# Patient Record
Sex: Female | Born: 1945 | Race: Black or African American | Hispanic: No | Marital: Married | Smoking: Current some day smoker
Health system: Southern US, Community
[De-identification: ages and names within clinical notes are randomized; demographics above are authoritative.]

## PROBLEM LIST (undated history)

## (undated) ENCOUNTER — Emergency Department: Admission: EM | Payer: Medicare HMO

## (undated) DIAGNOSIS — I10 Essential (primary) hypertension: Secondary | ICD-10-CM

## (undated) DIAGNOSIS — N189 Chronic kidney disease, unspecified: Secondary | ICD-10-CM

## (undated) DIAGNOSIS — M810 Age-related osteoporosis without current pathological fracture: Secondary | ICD-10-CM

---

## 2001-03-13 HISTORY — PX: JOINT REPLACEMENT: SHX530

## 2007-02-26 ENCOUNTER — Ambulatory Visit: Payer: Self-pay

## 2007-03-05 ENCOUNTER — Ambulatory Visit: Payer: Self-pay

## 2008-01-22 ENCOUNTER — Ambulatory Visit: Payer: Self-pay

## 2009-01-13 ENCOUNTER — Ambulatory Visit: Payer: Self-pay

## 2010-01-19 ENCOUNTER — Ambulatory Visit: Payer: Self-pay | Admitting: Family Medicine

## 2011-01-23 ENCOUNTER — Ambulatory Visit: Payer: Self-pay | Admitting: Family Medicine

## 2011-05-17 ENCOUNTER — Ambulatory Visit: Payer: Self-pay | Admitting: Family Medicine

## 2011-06-07 DIAGNOSIS — M810 Age-related osteoporosis without current pathological fracture: Secondary | ICD-10-CM | POA: Insufficient documentation

## 2012-02-07 ENCOUNTER — Ambulatory Visit: Payer: Self-pay | Admitting: Family Medicine

## 2013-01-06 ENCOUNTER — Ambulatory Visit: Payer: Self-pay | Admitting: Family Medicine

## 2013-03-03 ENCOUNTER — Ambulatory Visit: Payer: Self-pay | Admitting: Family Medicine

## 2013-08-03 ENCOUNTER — Emergency Department: Payer: Self-pay | Admitting: Emergency Medicine

## 2013-08-11 ENCOUNTER — Emergency Department: Payer: Self-pay | Admitting: Emergency Medicine

## 2014-03-04 ENCOUNTER — Ambulatory Visit: Payer: Self-pay | Admitting: Family Medicine

## 2014-07-09 ENCOUNTER — Other Ambulatory Visit: Payer: Self-pay | Admitting: Family Medicine

## 2014-07-09 DIAGNOSIS — M810 Age-related osteoporosis without current pathological fracture: Secondary | ICD-10-CM

## 2014-07-16 ENCOUNTER — Ambulatory Visit: Payer: Self-pay

## 2014-07-27 ENCOUNTER — Ambulatory Visit
Admission: RE | Admit: 2014-07-27 | Discharge: 2014-07-27 | Disposition: A | Discharge status: Home / Self Care | Payer: Medicare HMO | Source: Ambulatory Visit | Attending: Family Medicine | Admitting: Family Medicine

## 2014-07-27 DIAGNOSIS — M8580 Other specified disorders of bone density and structure, unspecified site: Secondary | ICD-10-CM | POA: Diagnosis not present

## 2014-07-27 DIAGNOSIS — Z8739 Personal history of other diseases of the musculoskeletal system and connective tissue: Secondary | ICD-10-CM | POA: Diagnosis present

## 2014-07-27 DIAGNOSIS — M810 Age-related osteoporosis without current pathological fracture: Secondary | ICD-10-CM

## 2014-07-27 DIAGNOSIS — N189 Chronic kidney disease, unspecified: Secondary | ICD-10-CM | POA: Diagnosis present

## 2014-10-01 ENCOUNTER — Encounter: Payer: Self-pay | Admitting: *Deleted

## 2014-10-02 ENCOUNTER — Encounter: Payer: Self-pay | Admitting: Anesthesiology

## 2014-10-02 ENCOUNTER — Encounter: Payer: Self-pay | Admitting: *Deleted

## 2014-10-02 ENCOUNTER — Ambulatory Visit
Admission: RE | Admit: 2014-10-02 | Discharge: 2014-10-02 | Disposition: A | Discharge status: Home / Self Care | Payer: Medicare HMO | Source: Ambulatory Visit | Attending: Gastroenterology | Admitting: Gastroenterology

## 2014-10-02 ENCOUNTER — Encounter
Admission: RE | Disposition: A | Discharge status: Home / Self Care | Payer: Self-pay | Source: Ambulatory Visit | Attending: Gastroenterology

## 2014-10-02 DIAGNOSIS — N183 Chronic kidney disease, stage 3 (moderate): Secondary | ICD-10-CM | POA: Insufficient documentation

## 2014-10-02 DIAGNOSIS — Z1211 Encounter for screening for malignant neoplasm of colon: Secondary | ICD-10-CM | POA: Insufficient documentation

## 2014-10-02 DIAGNOSIS — D125 Benign neoplasm of sigmoid colon: Secondary | ICD-10-CM | POA: Diagnosis not present

## 2014-10-02 DIAGNOSIS — I129 Hypertensive chronic kidney disease with stage 1 through stage 4 chronic kidney disease, or unspecified chronic kidney disease: Secondary | ICD-10-CM | POA: Diagnosis not present

## 2014-10-02 HISTORY — PX: COLONOSCOPY WITH PROPOFOL: SHX5780

## 2014-10-02 HISTORY — DX: Chronic kidney disease, unspecified: N18.9

## 2014-10-02 HISTORY — DX: Age-related osteoporosis without current pathological fracture: M81.0

## 2014-10-02 HISTORY — DX: Essential (primary) hypertension: I10

## 2014-10-02 HISTORY — DX: Hypercalcemia: E83.52

## 2014-10-02 SURGERY — COLONOSCOPY WITH PROPOFOL
Anesthesia: General

## 2014-10-02 MED ORDER — LIDOCAINE HCL (PF) 1 % IJ SOLN
2.0000 mL | Freq: Once | INTRAMUSCULAR | Status: AC
  Administered 2014-10-02: 0.3 mL via INTRADERMAL

## 2014-10-02 MED ORDER — LIDOCAINE HCL (PF) 1 % IJ SOLN
INTRAMUSCULAR | Status: AC
  Administered 2014-10-02: 0.3 mL via INTRADERMAL
  Filled 2014-10-02: qty 2

## 2014-10-02 MED ORDER — SODIUM CHLORIDE 0.9 % IV SOLN
INTRAVENOUS | Status: DC
  Administered 2014-10-02: 1000 mL via INTRAVENOUS

## 2014-10-02 MED ORDER — MIDAZOLAM HCL 5 MG/5ML IJ SOLN
INTRAMUSCULAR | Status: DC | PRN
  Administered 2014-10-02: 2 mg via INTRAVENOUS
  Administered 2014-10-02: 1 mg via INTRAVENOUS
  Administered 2014-10-02: 2 mg via INTRAVENOUS

## 2014-10-02 MED ORDER — SODIUM CHLORIDE 0.9 % IV SOLN: INTRAVENOUS | Status: DC

## 2014-10-02 MED ORDER — FENTANYL CITRATE (PF) 100 MCG/2ML IJ SOLN
INTRAMUSCULAR | Status: AC
  Filled 2014-10-02: qty 4

## 2014-10-02 MED ORDER — MIDAZOLAM HCL 5 MG/5ML IJ SOLN
INTRAMUSCULAR | Status: AC
  Filled 2014-10-02: qty 10

## 2014-10-02 MED ORDER — FENTANYL CITRATE (PF) 100 MCG/2ML IJ SOLN
INTRAMUSCULAR | Status: DC | PRN
  Administered 2014-10-02: 50 ug via INTRAVENOUS
  Administered 2014-10-02: 25 ug via INTRAVENOUS
  Administered 2014-10-02: 50 ug via INTRAVENOUS

## 2014-10-02 NOTE — Discharge Instructions (Signed)

## 2014-10-02 NOTE — Op Note (Signed)
[facility] Gastroenterology Patient Name: Michelle Kaufman Procedure Date: 10/02/2014 10:19 AM MRN: [id number] Account #: [id number] Date of Birth: 1945-07-14 Admit Type: Outpatient Age: 69 Room: [facility] ENDO ROOM 3 Gender: Female Note Status: Finalized Procedure:         Colonoscopy Indications:       Screening for colorectal malignant neoplasm, This is the                     patient's first colonoscopy Patient Profile:   This is a 69 year old female. Providers:         Gerrit Heck. Rayann Heman, MD Referring MD:      Marguerita Merles, MD (Referring MD) Medicines:         Fentanyl 125 micrograms IV, Midazolam 5 mg IV Complications:     No immediate complications. Procedure:         Pre-Anesthesia Assessment:                    - Prior to the procedure, a History and Physical was                     performed, and patient medications, allergies and                     sensitivities were reviewed. The patient's tolerance of                     previous anesthesia was reviewed.                    After obtaining informed consent, the colonoscope was                     passed under direct vision. Throughout the procedure, the                     patient's blood pressure, pulse, and oxygen saturations                     were monitored continuously. The Colonoscope was                     introduced through the anus and advanced to the the                     terminal ileum. The colonoscopy was somewhat difficult due                     to a tortuous colon. Successful completion of the                     procedure was aided by applying abdominal pressure. The                     patient tolerated the procedure well. The quality of the                     bowel preparation was good. Findings:      The perianal and digital rectal examinations were normal.      A 3 mm polyp was found in the sigmoid colon. The polyp was sessile. The       polyp was removed with a jumbo cold forceps.  Resection and retrieval       were complete.  The exam was otherwise without abnormality on direct and retroflexion       views. Impression:        - One 3 mm polyp in the sigmoid colon. Resected and                     retrieved.                    - The examination was otherwise normal on direct and                     retroflexion views. Recommendation:    - Observe patient in GI recovery unit.                    - High fiber diet.                    - Continue present medications.                    - Await pathology results.                    - Repeat colonoscopy for surveillance based on pathology                     results.                    - The findings and recommendations were discussed with the                     patient.                    - The findings and recommendations were discussed with the                     patient's family. Procedure Code(s): --- Professional ---                    [phone], Colonoscopy, flexible; with biopsy, single or                     multiple CPT copyright 2014 American Medical Association. All rights reserved. The codes documented in this report are preliminary and upon coder review may  be revised to meet current compliance requirements. Mellody Life, MD 10/02/2014 11:02:22 AM This report has been signed electronically. Number of Addenda: 0 Note Initiated On: 10/02/2014 10:19 AM Scope Withdrawal Time: 0 hours 15 minutes 17 seconds  Total Procedure Duration: 0 hours 28 minutes 36 seconds       [facility]

## 2014-10-02 NOTE — H&P (Signed)
  Primary Care Physician:  Pcp Not In System  Pre-Procedure History & Physical: HPI:  Michelle Kaufman is a 69 y.o. female is here for an colonoscopy.   Past Medical History  Diagnosis Date  . Chronic kidney disease     CKD, Stage 3  . Hypertension   . Osteoporosis   . Hypercalcemia     Past Surgical History  Procedure Laterality Date  . Joint replacement Right 2003    Prior to Admission medications   Medication Sig Start Date End Date Taking? Authorizing Provider  aspirin EC 81 MG tablet Take 81 mg by mouth daily.   Yes Historical Provider, MD  Cholecalciferol 1000 UNITS TBDP Take 1,000 Units by mouth daily.   Yes Historical Provider, MD  lisinopril (PRINIVIL,ZESTRIL) 10 MG tablet Take 10 mg by mouth daily.   Yes Historical Provider, MD    Allergies as of 09/11/2014  . (Not on File)    History reviewed. No pertinent family history.  History   Social History  . Marital Status: Married    Spouse Name: N/A  . Number of Children: N/A  . Years of Education: N/A   Occupational History  . Not on file.   Social History Main Topics  . Smoking status: Not on file  . Smokeless tobacco: Not on file  . Alcohol Use: Not on file  . Drug Use: Not on file  . Sexual Activity: Not on file   Other Topics Concern  . Not on file   Social History Narrative     Physical Exam: BP 117/69 mmHg  Pulse 65  Temp(Src) 97.3 F (36.3 C) (Tympanic)  Resp 17  Ht 5' (1.524 m)  Wt 56.7 kg (125 lb)  BMI 24.41 kg/m2  SpO2 96% General:   Alert,  pleasant and cooperative in NAD Head:  Normocephalic and atraumatic. Neck:  Supple; no masses or thyromegaly. Lungs:  Clear throughout to auscultation.    Heart:  Regular rate and rhythm. Abdomen:  Soft, nontender and nondistended. Normal bowel sounds, without guarding, and without rebound.   Neurologic:  Alert and  oriented x4;  grossly normal neurologically.  Impression/Plan: Michelle Kaufman is here for an colonoscopy to be performed for  screening  Risks, benefits, limitations, and alternatives regarding  colonoscopy have been reviewed with the patient.  Questions have been answered.  All parties agreeable.   Josefine Class, MD  10/02/2014, 10:09 AM

## 2014-10-05 ENCOUNTER — Encounter: Payer: Self-pay | Admitting: Gastroenterology

## 2014-10-06 LAB — SURGICAL PATHOLOGY

## 2014-11-26 DIAGNOSIS — M069 Rheumatoid arthritis, unspecified: Secondary | ICD-10-CM | POA: Insufficient documentation

## 2014-12-30 ENCOUNTER — Other Ambulatory Visit: Payer: Self-pay | Admitting: Preventative Medicine

## 2014-12-30 DIAGNOSIS — Z1239 Encounter for other screening for malignant neoplasm of breast: Secondary | ICD-10-CM

## 2015-02-15 DIAGNOSIS — J449 Chronic obstructive pulmonary disease, unspecified: Secondary | ICD-10-CM | POA: Insufficient documentation

## 2015-03-09 ENCOUNTER — Ambulatory Visit
Admission: RE | Admit: 2015-03-09 | Discharge: 2015-03-09 | Disposition: A | Discharge status: Home / Self Care | Payer: Medicare HMO | Source: Ambulatory Visit | Attending: Preventative Medicine | Admitting: Preventative Medicine

## 2015-03-09 ENCOUNTER — Other Ambulatory Visit: Payer: Self-pay | Admitting: Preventative Medicine

## 2015-03-09 DIAGNOSIS — Z1231 Encounter for screening mammogram for malignant neoplasm of breast: Secondary | ICD-10-CM | POA: Insufficient documentation

## 2015-03-09 DIAGNOSIS — Z1239 Encounter for other screening for malignant neoplasm of breast: Secondary | ICD-10-CM

## 2016-01-26 ENCOUNTER — Other Ambulatory Visit: Payer: Self-pay | Admitting: Family Medicine

## 2016-01-26 DIAGNOSIS — Z1231 Encounter for screening mammogram for malignant neoplasm of breast: Secondary | ICD-10-CM

## 2016-03-09 ENCOUNTER — Other Ambulatory Visit: Payer: Self-pay | Admitting: Family Medicine

## 2016-03-09 ENCOUNTER — Ambulatory Visit
Admission: RE | Admit: 2016-03-09 | Discharge: 2016-03-09 | Disposition: A | Discharge status: Home / Self Care | Payer: Medicare HMO | Source: Ambulatory Visit | Attending: Family Medicine | Admitting: Family Medicine

## 2016-03-09 DIAGNOSIS — Z1231 Encounter for screening mammogram for malignant neoplasm of breast: Secondary | ICD-10-CM | POA: Diagnosis not present

## 2016-11-08 ENCOUNTER — Other Ambulatory Visit: Payer: Self-pay | Admitting: Family Medicine

## 2016-11-08 DIAGNOSIS — Z1231 Encounter for screening mammogram for malignant neoplasm of breast: Secondary | ICD-10-CM

## 2016-11-08 DIAGNOSIS — Z13820 Encounter for screening for osteoporosis: Secondary | ICD-10-CM

## 2017-03-12 ENCOUNTER — Ambulatory Visit
Admission: RE | Admit: 2017-03-12 | Discharge: 2017-03-12 | Disposition: A | Discharge status: Home / Self Care | Payer: Medicare HMO | Source: Ambulatory Visit | Attending: Family Medicine | Admitting: Family Medicine

## 2017-03-12 DIAGNOSIS — Z1231 Encounter for screening mammogram for malignant neoplasm of breast: Secondary | ICD-10-CM | POA: Diagnosis not present

## 2017-03-12 DIAGNOSIS — Z13820 Encounter for screening for osteoporosis: Secondary | ICD-10-CM | POA: Diagnosis present

## 2017-03-12 DIAGNOSIS — M810 Age-related osteoporosis without current pathological fracture: Secondary | ICD-10-CM | POA: Diagnosis not present

## 2017-07-15 ENCOUNTER — Other Ambulatory Visit: Payer: Self-pay

## 2017-07-15 ENCOUNTER — Encounter: Payer: Self-pay | Admitting: Emergency Medicine

## 2017-07-15 ENCOUNTER — Emergency Department
Admission: EM | Admit: 2017-07-15 | Discharge: 2017-07-15 | Disposition: A | Discharge status: Home / Self Care | Payer: Medicare HMO | Attending: Emergency Medicine | Admitting: Emergency Medicine

## 2017-07-15 DIAGNOSIS — Z7982 Long term (current) use of aspirin: Secondary | ICD-10-CM | POA: Insufficient documentation

## 2017-07-15 DIAGNOSIS — F17210 Nicotine dependence, cigarettes, uncomplicated: Secondary | ICD-10-CM | POA: Diagnosis not present

## 2017-07-15 DIAGNOSIS — R42 Dizziness and giddiness: Secondary | ICD-10-CM | POA: Insufficient documentation

## 2017-07-15 DIAGNOSIS — R55 Syncope and collapse: Secondary | ICD-10-CM

## 2017-07-15 DIAGNOSIS — Z79899 Other long term (current) drug therapy: Secondary | ICD-10-CM | POA: Diagnosis not present

## 2017-07-15 DIAGNOSIS — N183 Chronic kidney disease, stage 3 (moderate): Secondary | ICD-10-CM | POA: Diagnosis not present

## 2017-07-15 DIAGNOSIS — R1110 Vomiting, unspecified: Secondary | ICD-10-CM | POA: Diagnosis not present

## 2017-07-15 DIAGNOSIS — I129 Hypertensive chronic kidney disease with stage 1 through stage 4 chronic kidney disease, or unspecified chronic kidney disease: Secondary | ICD-10-CM | POA: Insufficient documentation

## 2017-07-15 DIAGNOSIS — N390 Urinary tract infection, site not specified: Secondary | ICD-10-CM

## 2017-07-15 LAB — URINALYSIS, COMPLETE (UACMP) WITH MICROSCOPIC
Bilirubin Urine: NEGATIVE
Glucose, UA: NEGATIVE mg/dL
Ketones, ur: NEGATIVE mg/dL
LEUKOCYTES UA: NEGATIVE
Nitrite: POSITIVE — AB
PH: 5 (ref 5.0–8.0)
Protein, ur: NEGATIVE mg/dL
SPECIFIC GRAVITY, URINE: 1.01 (ref 1.005–1.030)

## 2017-07-15 LAB — CBC
HEMATOCRIT: 45.2 % (ref 35.0–47.0)
HEMOGLOBIN: 15 g/dL (ref 12.0–16.0)
MCH: 28.6 pg (ref 26.0–34.0)
MCHC: 33.1 g/dL (ref 32.0–36.0)
MCV: 86.4 fL (ref 80.0–100.0)
PLATELETS: 243 10*3/uL (ref 150–440)
RBC: 5.23 MIL/uL — AB (ref 3.80–5.20)
RDW: 14.9 % — ABNORMAL HIGH (ref 11.5–14.5)
WBC: 5.2 10*3/uL (ref 3.6–11.0)

## 2017-07-15 LAB — BASIC METABOLIC PANEL
ANION GAP: 8 (ref 5–15)
BUN: 16 mg/dL (ref 6–20)
CO2: 27 mmol/L (ref 22–32)
Calcium: 9.4 mg/dL (ref 8.9–10.3)
Chloride: 101 mmol/L (ref 101–111)
Creatinine, Ser: 0.91 mg/dL (ref 0.44–1.00)
GFR calc Af Amer: >60 mL/min (ref >60)
GLUCOSE: 101 mg/dL — AB (ref 65–99)
POTASSIUM: 3.7 mmol/L (ref 3.5–5.1)
Sodium: 136 mmol/L (ref 135–145)

## 2017-07-15 LAB — GLUCOSE, CAPILLARY: GLUCOSE-CAPILLARY: 87 mg/dL (ref 65–99)

## 2017-07-15 LAB — TROPONIN I

## 2017-07-15 MED ORDER — CEPHALEXIN 500 MG PO CAPS: 500.0000 mg | ORAL_CAPSULE | Freq: Three times a day (TID) | ORAL | 0 refills | Status: AC

## 2017-07-15 MED ORDER — CEPHALEXIN 500 MG PO CAPS
500.0000 mg | ORAL_CAPSULE | Freq: Once | ORAL | Status: AC
  Administered 2017-07-15: 500 mg via ORAL
  Filled 2017-07-15: qty 1

## 2017-07-15 MED ORDER — SODIUM CHLORIDE 0.9 % IV BOLUS
1000.0000 mL | Freq: Once | INTRAVENOUS | Status: AC
Start: 2017-07-15 — End: 2017-07-15
  Administered 2017-07-15: 1000 mL via INTRAVENOUS

## 2017-07-15 NOTE — ED Provider Notes (Signed)
[facility] Emergency Department Provider Note  ____________________________________________   First MD Initiated Contact with Patient 07/15/17 1406     (approximate)  I have reviewed the triage vital signs and the nursing notes.   HISTORY  Chief Complaint Dizziness   HPI Michelle Kaufman is a 72 y.o. female with a history of CKD who is presenting to the emergency department after near syncopal episode.  The patient says that she was sitting in church for 20 or 30 minutes when she stood up quickly and began to feel lightheaded.  She then vomited.  Did not pass out.  Said that the entire episode lasted several minutes and that she now still back to baseline.  Did not feel her heart racing.  No chest pain or shortness of breath.  Denies any burning with urination.   Past Medical History:  Diagnosis Date  . Chronic kidney disease    CKD, Stage 3  . Hypercalcemia   . Hypertension   . Osteoporosis     There are no active problems to display for this patient.   Past Surgical History:  Procedure Laterality Date  . COLONOSCOPY WITH PROPOFOL N/A 10/02/2014   Procedure: COLONOSCOPY WITH PROPOFOL;  Surgeon: Josefine Class, MD;  Location: [facility] ENDOSCOPY;  Service: Endoscopy;  Laterality: N/A;  . JOINT REPLACEMENT Right 2003    Prior to Admission medications   Medication Sig Start Date End Date Taking? Authorizing Provider  aspirin EC 81 MG tablet Take 81 mg by mouth daily.    [provider]  Cholecalciferol 1000 UNITS TBDP Take 1,000 Units by mouth daily.    [provider]  lisinopril (PRINIVIL,ZESTRIL) 10 MG tablet Take 10 mg by mouth daily.    [provider]    Allergies Patient has no known allergies.  Family History  Problem Relation Age of Onset  . Breast cancer Neg Hx     Social History Social History   Tobacco Use  . Smoking status: Current Every Day Smoker    Packs/day: 0.50    Types: Cigarettes  .  Smokeless tobacco: Never Used  Substance Use Topics  . Alcohol use: Not on file  . Drug use: Not on file    Review of Systems  Constitutional: No fever/chills Eyes: No visual changes. ENT: No sore throat. Cardiovascular: Denies chest pain. Respiratory: Denies shortness of breath. Gastrointestinal: No abdominal pain.  No nausea, no vomiting.  No diarrhea.  No constipation. Genitourinary: Negative for dysuria. Musculoskeletal: Negative for back pain. Skin: Negative for rash. Neurological: Negative for headaches, focal weakness or numbness.   ____________________________________________   PHYSICAL EXAM:  VITAL SIGNS: ED Triage Vitals  Enc Vitals Group     BP 07/15/17 1312 (!) 145/97     Pulse Rate 07/15/17 1312 68     Resp 07/15/17 1312 18     Temp 07/15/17 1312 98.3 F (36.8 C)     Temp Source 07/15/17 1312 Oral     SpO2 07/15/17 1312 97 %     Weight 07/15/17 1313 130 lb (59 kg)     Height 07/15/17 1313 5' (1.524 m)     Head Circumference --      Peak Flow --      Pain Score 07/15/17 1313 0     Pain Loc --      Pain Edu? --      Excl. in [place]? --     Constitutional: Alert and oriented. Well appearing and in no  acute distress. Eyes: Conjunctivae are normal.  Head: Atraumatic. Nose: No congestion/rhinnorhea. Mouth/Throat: Mucous membranes are moist.  Neck: No stridor.   Cardiovascular: Normal rate, regular rhythm. Grossly normal heart sounds.   Respiratory: Normal respiratory effort.  No retractions. Lungs CTAB. Gastrointestinal: Soft and nontender. No distention.  Musculoskeletal: No lower extremity tenderness nor edema.  No joint effusions. Neurologic:  Normal speech and language. No gross focal neurologic deficits are appreciated. Skin:  Skin is warm, dry and intact. No rash noted. Psychiatric: Mood and affect are normal. Speech and behavior are normal.  ____________________________________________   LABS (all labs ordered are listed, but only abnormal  results are displayed)  Labs Reviewed  BASIC METABOLIC PANEL - Abnormal; Notable for the following components:      Result Value   Glucose, Bld 101 (*)    All other components within normal limits  CBC - Abnormal; Notable for the following components:   RBC 5.23 (*)    RDW 14.9 (*)    All other components within normal limits  URINALYSIS, COMPLETE (UACMP) WITH MICROSCOPIC - Abnormal; Notable for the following components:   Color, Urine YELLOW (*)    APPearance CLEAR (*)    Hgb urine dipstick SMALL (*)    Nitrite POSITIVE (*)    Bacteria, UA MANY (*)    All other components within normal limits  URINE CULTURE  TROPONIN I  GLUCOSE, CAPILLARY  CBG MONITORING, ED   ____________________________________________  EKG  ED ECG REPORT I, Doran Stabler, the attending physician, personally viewed and interpreted this ECG.   Date: 07/15/2017  EKG Time: 1323  Rate: 66  Rhythm: normal sinus rhythm  Axis: Normal  Intervals:none  ST&T Change: No ST segment elevation or depression.  T wave inversions in V3 through V5.  No previous for comparison.  ____________________________________________  RADIOLOGY   ____________________________________________   PROCEDURES  Procedure(s) performed:   Procedures  Critical Care performed:   ____________________________________________   INITIAL IMPRESSION / ASSESSMENT AND PLAN / ED COURSE  Pertinent labs & imaging results that were available during my care of the patient were reviewed by me and considered in my medical decision making (see chart for details).  DDX: Vertigo, near-syncope, UTI, anemia, electrolyte abnormality, arrhythmia As part of my medical decision making, I reviewed the following data within the [place] Notes from prior outpatient records.  Patient likely with near syncope from UTI.  Patient asymptomatic at this time.  EKG changes without past EKG for comparison.  We will recheck her  troponin.  ----------------------------------------- 6:31 PM on 07/15/2017 -----------------------------------------  Patient at this time without complaint.  Troponin hours after the event is negative.  Unclear significance of T wave inversions.  Also unclear chronicity.  No previous EKG to compare with.  Patient will be discharged home with treatment for UTI.  Likely syncopal episode secondary to standing up quickly in combination with UTI.  Patient is understanding of the diagnosis as well as treatment plan willing to comply.  Patient will follow-up with cardiology. ____________________________________________   FINAL CLINICAL IMPRESSION(S) / ED DIAGNOSES  UTI.  Near syncope.    NEW MEDICATIONS STARTED DURING THIS VISIT:  New Prescriptions   No medications on file     Note:  This document was prepared using Dragon voice recognition software and may include unintentional dictation errors.     Orbie Pyo, MD 07/15/17 [phone]

## 2017-07-15 NOTE — ED Triage Notes (Signed)
Pt reports dizziness upon standing during church.  Pt states she has not been on medication for BP in 2 years.  Pt states she may have stood up too fast during church. Pt denies any other pain.  Pt also reports itching to the middle of her chest as well.

## 2017-07-17 LAB — URINE CULTURE: Culture: >=100000 — AB

## 2018-02-04 ENCOUNTER — Other Ambulatory Visit: Payer: Self-pay | Admitting: Family Medicine

## 2018-02-04 DIAGNOSIS — Z1231 Encounter for screening mammogram for malignant neoplasm of breast: Secondary | ICD-10-CM

## 2018-03-01 ENCOUNTER — Encounter: Payer: Self-pay | Admitting: Emergency Medicine

## 2018-03-01 ENCOUNTER — Other Ambulatory Visit: Payer: Self-pay

## 2018-03-01 ENCOUNTER — Emergency Department
Admission: EM | Admit: 2018-03-01 | Discharge: 2018-03-01 | Disposition: A | Discharge status: Home / Self Care | Payer: Medicare HMO | Attending: Emergency Medicine | Admitting: Emergency Medicine

## 2018-03-01 DIAGNOSIS — Z7982 Long term (current) use of aspirin: Secondary | ICD-10-CM | POA: Diagnosis not present

## 2018-03-01 DIAGNOSIS — M7022 Olecranon bursitis, left elbow: Secondary | ICD-10-CM | POA: Insufficient documentation

## 2018-03-01 DIAGNOSIS — N183 Chronic kidney disease, stage 3 (moderate): Secondary | ICD-10-CM | POA: Diagnosis not present

## 2018-03-01 DIAGNOSIS — Z79899 Other long term (current) drug therapy: Secondary | ICD-10-CM | POA: Diagnosis not present

## 2018-03-01 DIAGNOSIS — Y939 Activity, unspecified: Secondary | ICD-10-CM | POA: Diagnosis not present

## 2018-03-01 DIAGNOSIS — R2232 Localized swelling, mass and lump, left upper limb: Secondary | ICD-10-CM | POA: Diagnosis not present

## 2018-03-01 DIAGNOSIS — F17210 Nicotine dependence, cigarettes, uncomplicated: Secondary | ICD-10-CM | POA: Insufficient documentation

## 2018-03-01 DIAGNOSIS — I129 Hypertensive chronic kidney disease with stage 1 through stage 4 chronic kidney disease, or unspecified chronic kidney disease: Secondary | ICD-10-CM | POA: Diagnosis not present

## 2018-03-01 DIAGNOSIS — M25522 Pain in left elbow: Secondary | ICD-10-CM | POA: Diagnosis present

## 2018-03-01 MED ORDER — KETOROLAC TROMETHAMINE 30 MG/ML IJ SOLN
15.0000 mg | Freq: Once | INTRAMUSCULAR | Status: AC
  Administered 2018-03-01: 15 mg via INTRAMUSCULAR
  Filled 2018-03-01: qty 1

## 2018-03-01 NOTE — ED Notes (Signed)
PT OBSERVED WITH SWELLING TO LEFT ELBOW , WARM TO TOUCH , DENIES ANY INJURY , DOES REPORT THAT SHE SLEPT ON IT JUST PRIOR TO ONSET OF PAIN .

## 2018-03-01 NOTE — Discharge Instructions (Addendum)
As we discussed, we believe the pain and swelling in your elbow is due to a condition called bursitis.  Please use the wrap provided in the sling as needed for comfort.  You were given a medication called Toradol 15 mg intramuscular and starting in the morning we recommend that you take 400 mg of ibuprofen 3 times a day with meals.  However we encourage you to go to the [facility] walk-in clinic this morning for reassessment and further evaluation.    Return to the emergency department if you develop new or worsening symptoms that concern you.

## 2018-03-01 NOTE — ED Triage Notes (Addendum)
Patient ambulatory to triage with steady gait, without difficulty or distress noted; pt reports awoke with left elbow pain; denies any other accomp symptoms; denies any injury; st pain with movement

## 2018-03-01 NOTE — ED Provider Notes (Signed)
[facility] Emergency Department Provider Note  ____________________________________________   First MD Initiated Contact with Patient 03/01/18 [phone]     (approximate)  I have reviewed the triage vital signs and the nursing notes.   HISTORY  Chief Complaint Elbow Pain    HPI Michelle Kaufman is a 72 y.o. female with medical history as listed below which notably includes rheumatoid arthritis.  She presents for evaluation of acute onset pain and swelling in her left elbow.  She reports that her elbow was hurting a little bit before she went to bed but when she woke up she was lying on the elbow and the pain was severe.  It is worse with flexion and extension of the elbow and with touching the outside of the elbow.  Nothing in particular makes it better.  It seems little bit swollen but it is not red.  She has not had an infection of the arm of the skin and has had no recent injuries including no lacerations or injections.  She has not had this problem in the past.  She has no history of gout.  She does not take blood thinners.  She denies fever/chills, chest pain, shortness of breath, nausea, vomiting, and abdominal pain.  Past Medical History:  Diagnosis Date  . Chronic kidney disease    CKD, Stage 3  . Hypercalcemia   . Hypertension   . Osteoporosis     There are no active problems to display for this patient.   Past Surgical History:  Procedure Laterality Date  . COLONOSCOPY WITH PROPOFOL N/A 10/02/2014   Procedure: COLONOSCOPY WITH PROPOFOL;  Surgeon: Josefine Class, MD;  Location: [facility] ENDOSCOPY;  Service: Endoscopy;  Laterality: N/A;  . JOINT REPLACEMENT Right 2003    Prior to Admission medications   Medication Sig Start Date End Date Taking? Authorizing Provider  aspirin EC 81 MG tablet Take 81 mg by mouth daily.    [provider]  Cholecalciferol 1000 UNITS TBDP Take 1,000 Units by mouth daily.    [provider]    lisinopril (PRINIVIL,ZESTRIL) 10 MG tablet Take 10 mg by mouth daily.    [provider]    Allergies Patient has no known allergies.  Family History  Problem Relation Age of Onset  . Breast cancer Neg Hx     Social History Social History   Tobacco Use  . Smoking status: Current Every Day Smoker    Packs/day: 0.50    Types: Cigarettes  . Smokeless tobacco: Never Used  Substance Use Topics  . Alcohol use: Not on file  . Drug use: Not on file    Review of Systems Constitutional: No fever/chills Eyes: No visual changes. ENT: No sore throat. Cardiovascular: Denies chest pain. Respiratory: Denies shortness of breath. Gastrointestinal: No abdominal pain.  No nausea, no vomiting.  No diarrhea.  No constipation. Genitourinary: Negative for dysuria. Musculoskeletal: Pain and swelling in left elbow as described above.  Negative for neck pain.  Negative for back pain. Integumentary: Negative for rash, no redness or skin injury to the left elbow. Neurological: Negative for headaches, focal weakness or numbness.   ____________________________________________   PHYSICAL EXAM:  VITAL SIGNS: ED Triage Vitals  Enc Vitals Group     BP 03/01/18 0311 (!) 116/91     Pulse Rate 03/01/18 0311 70     Resp 03/01/18 0311 16     Temp 03/01/18 0311 97.9 F (36.6 C)     Temp Source 03/01/18  6578 Oral     SpO2 03/01/18 0311 98 %     Weight 03/01/18 0305 64 kg (141 lb)     Height 03/01/18 0305 1.575 m (5\' 2" )     Head Circumference --      Peak Flow --      Pain Score --      Pain Loc --      Pain Edu? --      Excl. in [place]? --     Constitutional: Alert and oriented. Well appearing and in no acute distress.  Eyes: Conjunctivae are normal.  Head: Atraumatic. Nose: No congestion/rhinnorhea. Mouth/Throat: Mucous membranes are moist. Neck: No stridor.  No meningeal signs.   Cardiovascular: Normal rate, regular rhythm. Good peripheral circulation. Respiratory: Normal  respiratory effort.  No retractions.  Musculoskeletal: The patient has some mild swelling around the olecranon process of the left elbow with no erythema and no abnormal warmth.  Is tender to palpation and fluctuant, consistent with inflammatory bursitis.  She resists flexion and extension of the elbow.  There is no acute skin disruption or indication that she has a superficial infection including cellulitis.  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 Neurologic:  Normal speech and language. No gross focal neurologic deficits are appreciated.  Skin:  Skin is warm, dry and intact. No rash noted.   ____________________________________________   LABS (all labs ordered are listed, but only abnormal results are displayed)  Labs Reviewed - No data to display ____________________________________________  EKG  ED ECG REPORT I, Hinda Kehr, the attending physician, personally viewed and interpreted this ECG.  Date: 03/01/2018 EKG Time: 3:15 AM Rate: 64 Rhythm: normal sinus rhythm QRS Axis: normal Intervals: normal ST/T Wave abnormalities: Inverted T waves in leads II, III, aVF, V3, V4, V5, and V6.  These are similar to inversions in prior EKGs.   Narrative Interpretation: no evidence of acute ischemia   ____________________________________________  RADIOLOGY   ED MD interpretation: No indication for imaging  Official radiology report(s): No results found.  ____________________________________________   PROCEDURES  Critical Care performed: No   Procedure(s) performed:   Procedures   ____________________________________________   INITIAL IMPRESSION / ASSESSMENT AND PLAN / ED COURSE  As part of my medical decision making, I reviewed the following data within the [place] notes reviewed and incorporated, EKG interpreted , Old EKG reviewed and Notes from  prior ED visits    Differential diagnosis includes, but is not limited to, inflammatory bursitis, septic bursitis, septic arthritis, cellulitis, gout, less likely DVT or acute bony injury including fracture or dislocation.  The physical exam is consistent with an olecranon bursitis that is likely inflammatory in nature and could have been worsened by her sleeping position because she does state that she was sleeping on the elbow.  I offered to aspirate the bursa to look for signs of infection but after having my usual and customary risks and benefits discussion, the patient declined.  She prefers conservative treatment including Toradol 50 mg intramuscular, Ace wrap, sling, and close follow-up with orthopedics clinic later today.  I think that is appropriate.  My suspicion for septic arthritis is extremely low and even for septic bursitis.  There is no evidence of any overlying skin infection and the process does appear inflammatory.  I gave strict return precautions if her symptoms worsen and she plans to go  to the orthopedic walk-in clinic later today.  There is no indication for imaging as plain radiographs will not be helpful and an MRI is unnecessary in the emergent setting.     ____________________________________________  FINAL CLINICAL IMPRESSION(S) / ED DIAGNOSES  Final diagnoses:  Olecranon bursitis of left elbow     MEDICATIONS GIVEN DURING THIS VISIT:  Medications  ketorolac (TORADOL) 30 MG/ML injection 15 mg (has no administration in time range)     ED Discharge Orders    None       Note:  This document was prepared using Dragon voice recognition software and may include unintentional dictation errors.    Hinda Kehr, MD 03/01/18 [phone]

## 2018-03-15 ENCOUNTER — Ambulatory Visit
Admission: RE | Admit: 2018-03-15 | Discharge: 2018-03-15 | Disposition: A | Discharge status: Home / Self Care | Payer: Medicare HMO | Source: Ambulatory Visit | Attending: Family Medicine | Admitting: Family Medicine

## 2018-03-15 DIAGNOSIS — Z1231 Encounter for screening mammogram for malignant neoplasm of breast: Secondary | ICD-10-CM | POA: Insufficient documentation

## 2018-05-17 DIAGNOSIS — E559 Vitamin D deficiency, unspecified: Secondary | ICD-10-CM | POA: Insufficient documentation

## 2018-12-13 DIAGNOSIS — K219 Gastro-esophageal reflux disease without esophagitis: Secondary | ICD-10-CM | POA: Insufficient documentation

## 2018-12-18 ENCOUNTER — Other Ambulatory Visit: Payer: Self-pay | Admitting: Family Medicine

## 2018-12-18 DIAGNOSIS — Z13820 Encounter for screening for osteoporosis: Secondary | ICD-10-CM

## 2018-12-24 ENCOUNTER — Other Ambulatory Visit: Payer: Self-pay | Admitting: Family Medicine

## 2018-12-24 DIAGNOSIS — Z1231 Encounter for screening mammogram for malignant neoplasm of breast: Secondary | ICD-10-CM

## 2019-03-17 ENCOUNTER — Ambulatory Visit
Admission: RE | Admit: 2019-03-17 | Discharge: 2019-03-17 | Disposition: A | Discharge status: Home / Self Care | Payer: Medicare HMO | Source: Ambulatory Visit | Attending: Family Medicine | Admitting: Family Medicine

## 2019-03-17 DIAGNOSIS — Z780 Asymptomatic menopausal state: Secondary | ICD-10-CM | POA: Insufficient documentation

## 2019-03-17 DIAGNOSIS — Z1231 Encounter for screening mammogram for malignant neoplasm of breast: Secondary | ICD-10-CM | POA: Diagnosis present

## 2019-03-17 DIAGNOSIS — Z13820 Encounter for screening for osteoporosis: Secondary | ICD-10-CM | POA: Diagnosis present

## 2019-09-17 ENCOUNTER — Encounter: Payer: Self-pay | Admitting: Emergency Medicine

## 2019-09-17 DIAGNOSIS — F17210 Nicotine dependence, cigarettes, uncomplicated: Secondary | ICD-10-CM | POA: Insufficient documentation

## 2019-09-17 DIAGNOSIS — I129 Hypertensive chronic kidney disease with stage 1 through stage 4 chronic kidney disease, or unspecified chronic kidney disease: Secondary | ICD-10-CM | POA: Diagnosis not present

## 2019-09-17 DIAGNOSIS — R030 Elevated blood-pressure reading, without diagnosis of hypertension: Secondary | ICD-10-CM | POA: Diagnosis not present

## 2019-09-17 DIAGNOSIS — Z7982 Long term (current) use of aspirin: Secondary | ICD-10-CM | POA: Diagnosis not present

## 2019-09-17 DIAGNOSIS — N183 Chronic kidney disease, stage 3 unspecified: Secondary | ICD-10-CM | POA: Insufficient documentation

## 2019-09-17 LAB — CBC
HCT: 42.3 % (ref 36.0–46.0)
Hemoglobin: 13.7 g/dL (ref 12.0–15.0)
MCH: 28 pg (ref 26.0–34.0)
MCHC: 32.4 g/dL (ref 30.0–36.0)
MCV: 86.5 fL (ref 80.0–100.0)
Platelets: 243 10*3/uL (ref 150–400)
RBC: 4.89 MIL/uL (ref 3.87–5.11)
RDW: 13.8 % (ref 11.5–15.5)
WBC: 6.7 10*3/uL (ref 4.0–10.5)
nRBC: 0 % (ref 0.0–0.2)

## 2019-09-17 LAB — BASIC METABOLIC PANEL
Anion gap: 9 (ref 5–15)
BUN: 17 mg/dL (ref 8–23)
CO2: 26 mmol/L (ref 22–32)
Calcium: 9.2 mg/dL (ref 8.9–10.3)
Chloride: 104 mmol/L (ref 98–111)
Creatinine, Ser: 1.08 mg/dL — ABNORMAL HIGH (ref 0.44–1.00)
GFR calc Af Amer: 59 mL/min — ABNORMAL LOW (ref >60)
GFR calc non Af Amer: 51 mL/min — ABNORMAL LOW (ref >60)
Glucose, Bld: 97 mg/dL (ref 70–99)
Potassium: 3.9 mmol/L (ref 3.5–5.1)
Sodium: 139 mmol/L (ref 135–145)

## 2019-09-17 LAB — TROPONIN I (HIGH SENSITIVITY): Troponin I (High Sensitivity): 6 ng/L (ref <18)

## 2019-09-17 NOTE — ED Triage Notes (Signed)
Pt reports she had red meat for dinner and afterwards has experienced HA and high BP readings at home. Pt denies hypertension.

## 2019-09-18 ENCOUNTER — Emergency Department
Admission: EM | Admit: 2019-09-18 | Discharge: 2019-09-18 | Disposition: A | Discharge status: Home / Self Care | Payer: Medicare HMO | Attending: Emergency Medicine | Admitting: Emergency Medicine

## 2019-09-18 DIAGNOSIS — R030 Elevated blood-pressure reading, without diagnosis of hypertension: Secondary | ICD-10-CM

## 2019-09-18 NOTE — ED Notes (Signed)
Computer not available for signature- printed and had pt sign hard copy for discharge

## 2019-09-18 NOTE — ED Notes (Signed)
Pt reassessed in the [place], VS rechecked.

## 2019-09-18 NOTE — ED Provider Notes (Signed)
[facility] Emergency Department Provider Note   ____________________________________________   First MD Initiated Contact with Patient 09/18/19 [phone]     (approximate)  I have reviewed the triage vital signs and the nursing notes.   HISTORY  Chief Complaint Hypertension    HPI Michelle Kaufman is a 74 y.o. female with past medical history of hypertension and CKD who presents to the ED complaining of elevated blood pressure.  Patient reports that she was checking her blood pressure at home last night when it seemed to be running high.  This was associated with a mild diffuse headache, she denies any chest pain, shortness of breath, fevers, cough, nausea, dysuria, or hematuria.  She states she previously took medication for her blood pressure but her PCP took her off these medications a couple of years ago and her blood pressure has been normal since.  She had follow-up with her PCP a couple of weeks ago, when her blood pressure was normal at the office.  She denies any vision changes, speech changes, numbness, or weakness.        Past Medical History:  Diagnosis Date  . Chronic kidney disease    CKD, Stage 3  . Hypercalcemia   . Hypertension   . Osteoporosis     There are no problems to display for this patient.   Past Surgical History:  Procedure Laterality Date  . COLONOSCOPY WITH PROPOFOL N/A 10/02/2014   Procedure: COLONOSCOPY WITH PROPOFOL;  Surgeon: Josefine Class, MD;  Location: [facility] ENDOSCOPY;  Service: Endoscopy;  Laterality: N/A;  . JOINT REPLACEMENT Right 2003    Prior to Admission medications   Medication Sig Start Date End Date Taking? Authorizing Provider  aspirin EC 81 MG tablet Take 81 mg by mouth daily.    [provider]  Cholecalciferol 1000 UNITS TBDP Take 1,000 Units by mouth daily.    [provider]  lisinopril (PRINIVIL,ZESTRIL) 10 MG tablet Take 10 mg by mouth daily.    [provider]     Allergies Patient has no known allergies.  Family History  Problem Relation Age of Onset  . Breast cancer Neg Hx     Social History Social History   Tobacco Use  . Smoking status: Current Every Day Smoker    Packs/day: 0.50    Types: Cigarettes  . Smokeless tobacco: Never Used  Substance Use Topics  . Alcohol use: Not on file  . Drug use: Not on file    Review of Systems  Constitutional: No fever/chills.  Positive for elevated blood pressure. Eyes: No visual changes. ENT: No sore throat. Cardiovascular: Denies chest pain. Respiratory: Denies shortness of breath. Gastrointestinal: No abdominal pain.  No nausea, no vomiting.  No diarrhea.  No constipation. Genitourinary: Negative for dysuria. Musculoskeletal: Negative for back pain. Skin: Negative for rash. Neurological: Positive for headaches, negative for focal weakness or numbness.  ____________________________________________   PHYSICAL EXAM:  VITAL SIGNS: ED Triage Vitals [09/17/19 2205]  Enc Vitals Group     BP (!) 159/84     Pulse Rate 61     Resp 18     Temp 98.2 F (36.8 C)     Temp Source Oral     SpO2 100 %     Weight      Height      Head Circumference      Peak Flow      Pain Score      Pain Loc  Pain Edu?      Excl. in [place]?    Constitutional: Alert and oriented. Eyes: Conjunctivae are normal. Head: Atraumatic. Nose: No congestion/rhinnorhea. Mouth/Throat: Mucous membranes are moist. Neck: Normal ROM Cardiovascular: Normal rate, regular rhythm. Grossly normal heart sounds.  2+ radial pulses bilaterally. Respiratory: Normal respiratory effort.  No retractions. Lungs CTAB. Gastrointestinal: Soft and nontender. No distention. Genitourinary: deferred Musculoskeletal: No lower extremity tenderness nor edema. Neurologic:  Normal speech and language. No gross focal neurologic deficits are appreciated. Skin:  Skin is warm, dry and intact. No rash noted. Psychiatric: Mood and affect  are normal. Speech and behavior are normal.  ____________________________________________   LABS (all labs ordered are listed, but only abnormal results are displayed)  Labs Reviewed  BASIC METABOLIC PANEL - Abnormal; Notable for the following components:      Result Value   Creatinine, Ser 1.08 (*)    GFR calc non Af Amer 51 (*)    GFR calc Af Amer 59 (*)    All other components within normal limits  CBC  TROPONIN I (HIGH SENSITIVITY)   ____________________________________________  EKG  ED ECG REPORT I, Blake Divine, the attending physician, personally viewed and interpreted this ECG.   Date: 09/18/2019  EKG Time: 22:11  Rate: 58  Rhythm: sinus bradycardia  Axis: Normal  Intervals:none  ST&T Change: Inferolateral T wave inversions   PROCEDURES  Procedure(s) performed (including Critical Care):  Procedures   ____________________________________________   INITIAL IMPRESSION / ASSESSMENT AND PLAN / ED COURSE       74 year old female with past medical history of hypertension and CKD presents to the ED complaining of elevated blood pressure measurements at home last night as well as a mild diffuse headache.  Headache is described as gradual onset with no focal neurologic deficits and I doubt SAH or other acute intracranial process.  EKG shows T wave inversions similar to prior with no acute changes, troponin within normal limits.  Remainder of lab work is unremarkable, blood pressure has normalized and no evidence of hypertensive emergency.  Patient is appropriate for discharge home with PCP follow-up, was counseled to return to the ED for new or worsening symptoms.  Patient agrees with plan.      ____________________________________________   FINAL CLINICAL IMPRESSION(S) / ED DIAGNOSES  Final diagnoses:  Elevated blood pressure reading     ED Discharge Orders    None       Note:  This document was prepared using Dragon voice recognition software and  may include unintentional dictation errors.   Blake Divine, MD 09/18/19 [phone]

## 2019-09-27 IMAGING — MG DIGITAL SCREENING BILATERAL MAMMOGRAM WITH TOMO AND CAD
6 of 10 series · 6 of 30 positions shown · non-contrast
Comparison: Previous exam(s).

CLINICAL DATA: Screening.

EXAM:
DIGITAL SCREENING BILATERAL MAMMOGRAM WITH TOMO AND CAD

[R MLO synth-2D]
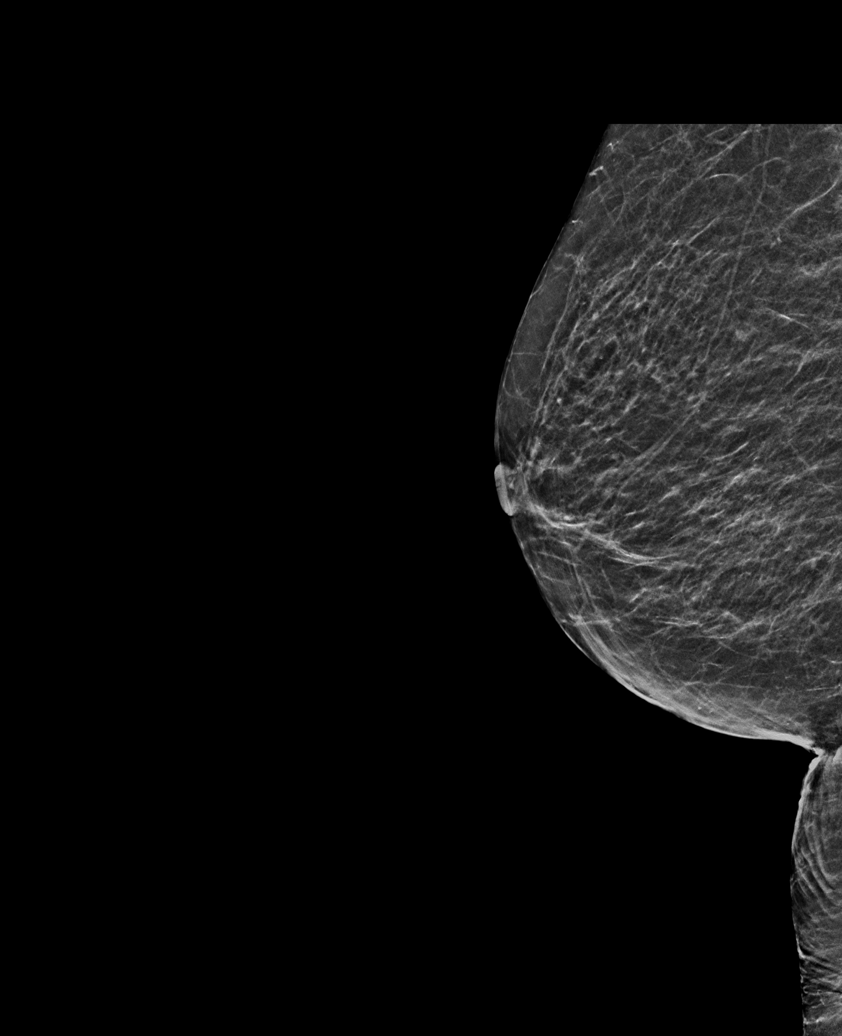

[L MLO synth-2D (1 of 2)]
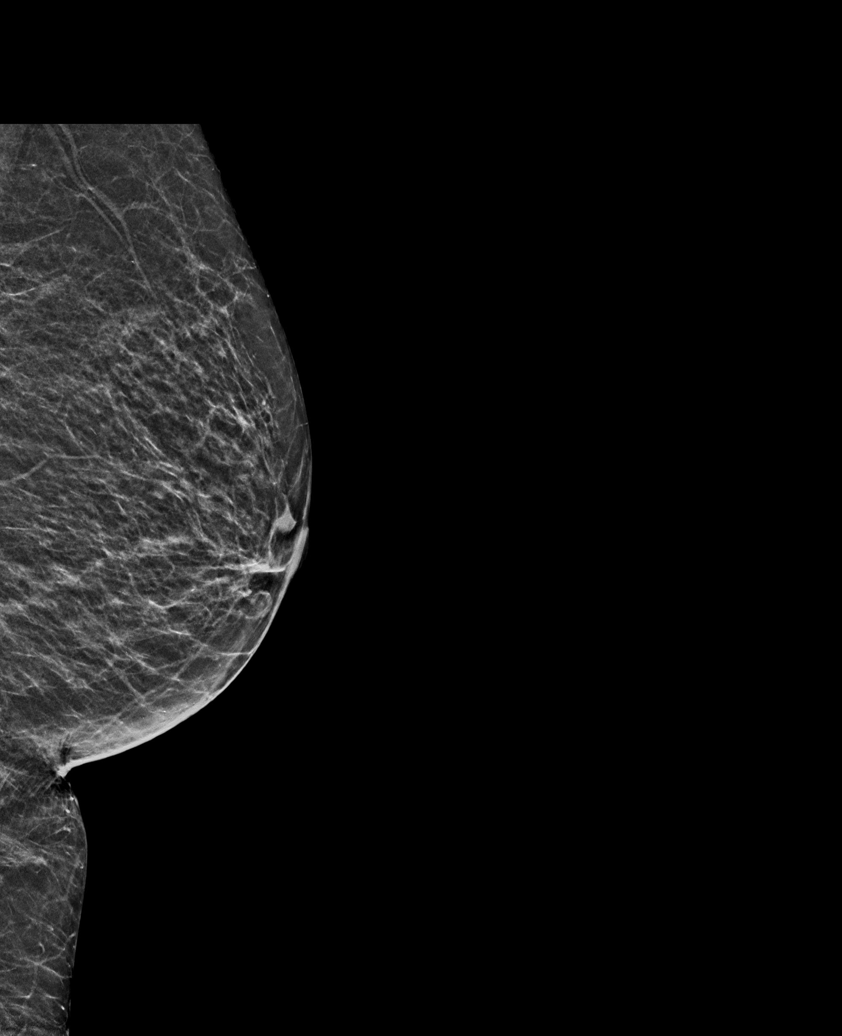

[L CC synth-2D]
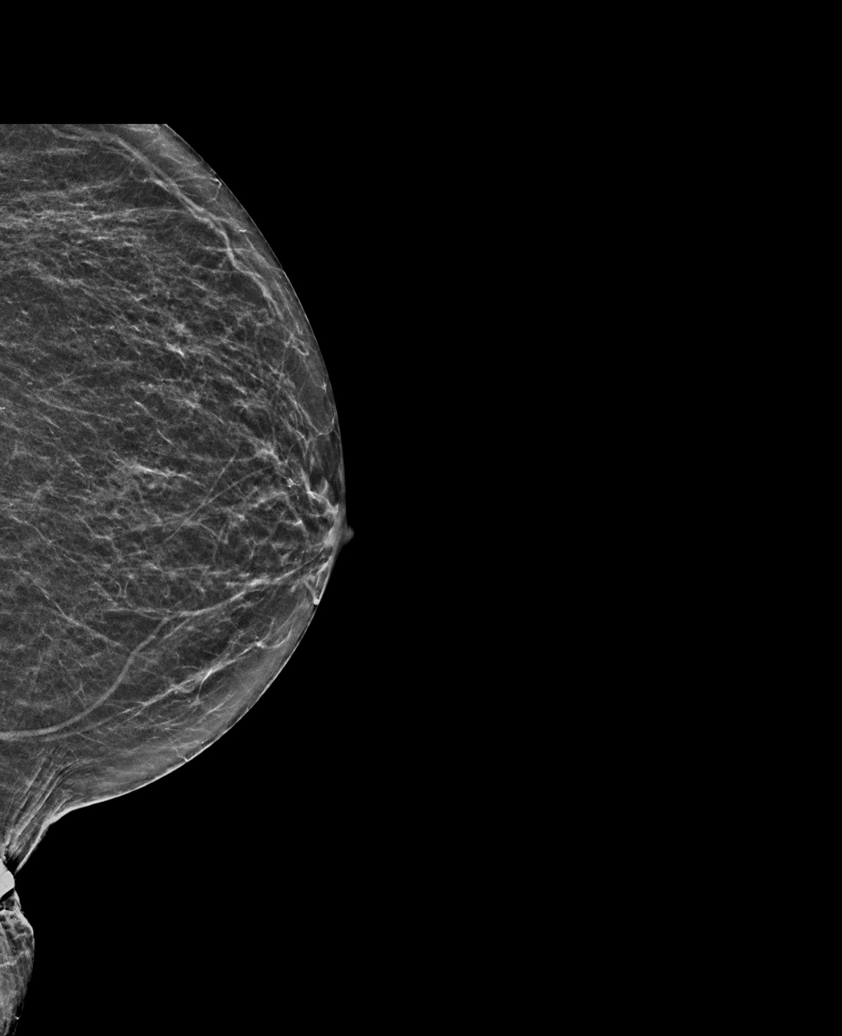

[L MLO synth-2D (2 of 2)]
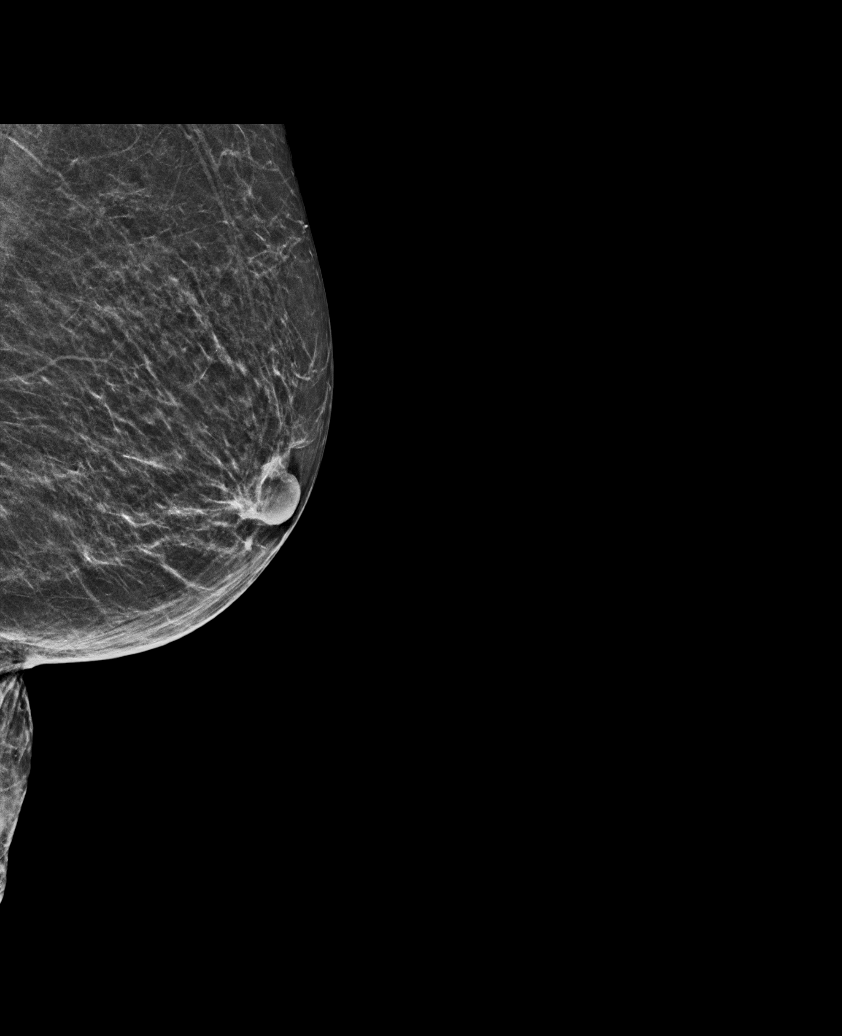

[R CC synth-2D]
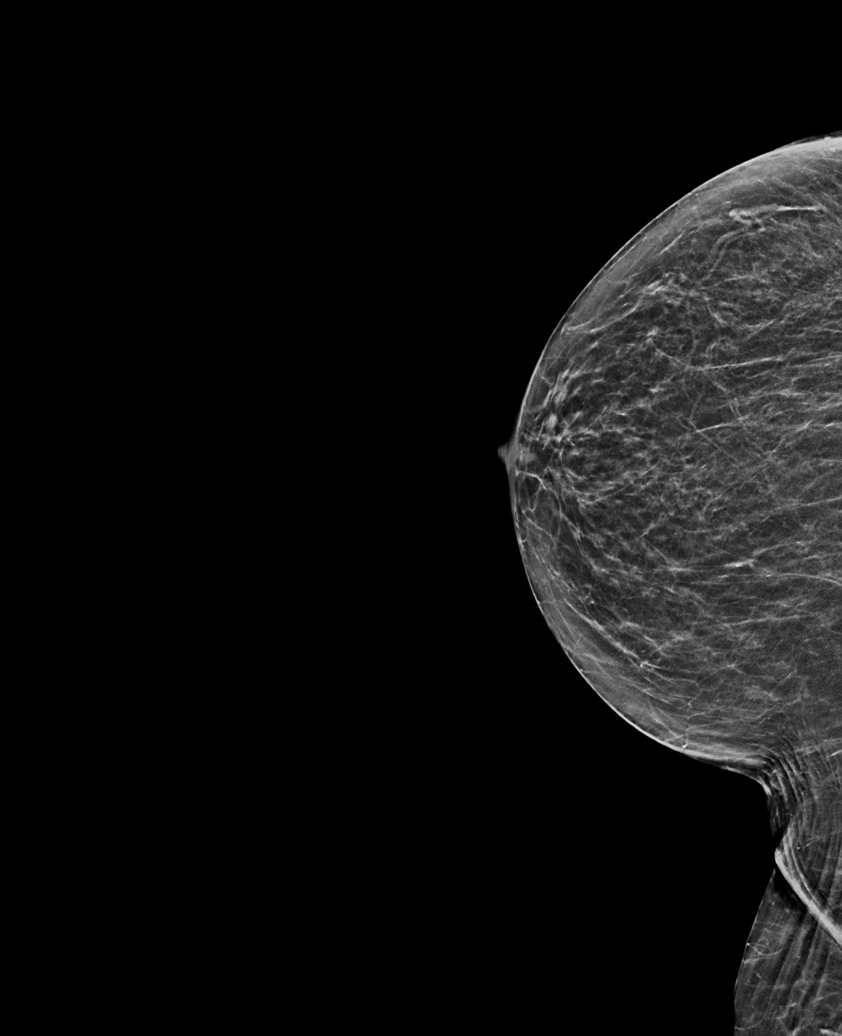

[L CC tomo · tomo slice 23/45.0]
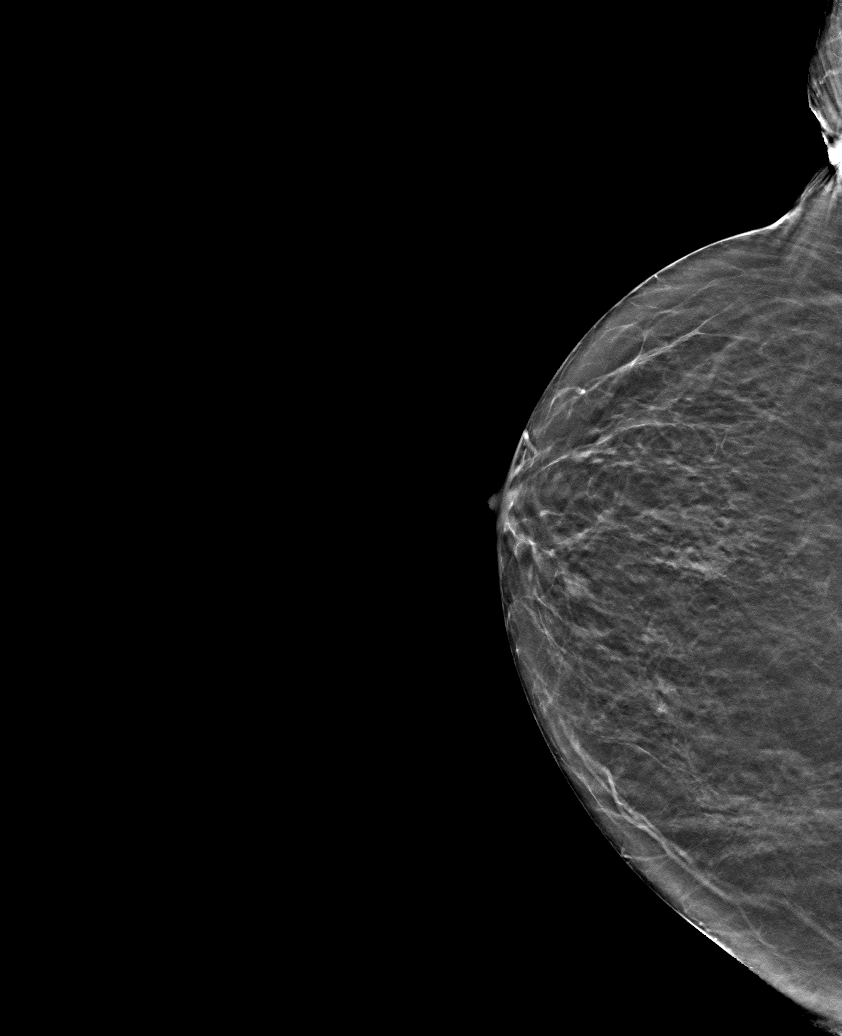

[6 of 30 positions shown; findings below may reference images not displayed]

ACR Breast Density Category b: There are scattered areas of
fibroglandular density.
FINDINGS: There are no findings suspicious for malignancy. Images were
processed with CAD.
IMPRESSION: No mammographic evidence of malignancy. A result letter of this
screening mammogram will be mailed directly to the patient.

RECOMMENDATION:
Screening mammogram in one year. (Code:CN-U-775)

BI-RADS CATEGORY  1: Negative.

## 2019-10-27 ENCOUNTER — Encounter: Payer: Self-pay | Admitting: General Practice

## 2020-01-30 ENCOUNTER — Other Ambulatory Visit: Payer: Self-pay | Admitting: Family Medicine

## 2020-01-30 ENCOUNTER — Ambulatory Visit
Admission: RE | Admit: 2020-01-30 | Discharge: 2020-01-30 | Disposition: A | Discharge status: Home / Self Care | Payer: Medicare HMO | Attending: Family Medicine | Admitting: Family Medicine

## 2020-01-30 ENCOUNTER — Ambulatory Visit
Admission: RE | Admit: 2020-01-30 | Discharge: 2020-01-30 | Disposition: A | Discharge status: Home / Self Care | Payer: Medicare HMO | Source: Ambulatory Visit | Attending: Family Medicine | Admitting: Family Medicine

## 2020-01-30 ENCOUNTER — Other Ambulatory Visit: Payer: Self-pay

## 2020-01-30 DIAGNOSIS — R634 Abnormal weight loss: Secondary | ICD-10-CM

## 2020-02-10 ENCOUNTER — Ambulatory Visit: Payer: Medicare HMO | Admitting: Gastroenterology

## 2020-03-08 ENCOUNTER — Other Ambulatory Visit: Payer: Self-pay | Admitting: Family Medicine

## 2020-03-08 DIAGNOSIS — Z1231 Encounter for screening mammogram for malignant neoplasm of breast: Secondary | ICD-10-CM

## 2020-03-11 ENCOUNTER — Telehealth: Payer: Self-pay

## 2020-03-11 NOTE — Telephone Encounter (Signed)
Returned patients call in regards to her appt scheduled with Dr. Tobi Bastos (New pt appt 03/24/20 9:15am).  I was unable to contact her in regards to her request to cancel colonoscopy.  Based on review of her appts, and procedures-colonoscopy has not been scheduled.  Her appt is an office visit new patient.  Thanks,  [place], [place]

## 2020-03-24 ENCOUNTER — Ambulatory Visit: Payer: Medicare HMO | Admitting: Gastroenterology

## 2020-04-11 DIAGNOSIS — Z9660 Presence of unspecified orthopedic joint implant: Secondary | ICD-10-CM | POA: Diagnosis not present

## 2020-04-11 DIAGNOSIS — Z79899 Other long term (current) drug therapy: Secondary | ICD-10-CM | POA: Diagnosis not present

## 2020-04-11 DIAGNOSIS — N183 Chronic kidney disease, stage 3 unspecified: Secondary | ICD-10-CM | POA: Diagnosis not present

## 2020-04-11 DIAGNOSIS — F17210 Nicotine dependence, cigarettes, uncomplicated: Secondary | ICD-10-CM | POA: Insufficient documentation

## 2020-04-11 DIAGNOSIS — I129 Hypertensive chronic kidney disease with stage 1 through stage 4 chronic kidney disease, or unspecified chronic kidney disease: Secondary | ICD-10-CM | POA: Insufficient documentation

## 2020-04-11 DIAGNOSIS — R42 Dizziness and giddiness: Secondary | ICD-10-CM | POA: Insufficient documentation

## 2020-04-11 DIAGNOSIS — Z7982 Long term (current) use of aspirin: Secondary | ICD-10-CM | POA: Diagnosis not present

## 2020-04-11 LAB — CBC
HCT: 44.3 % (ref 36.0–46.0)
Hemoglobin: 14.4 g/dL (ref 12.0–15.0)
MCH: 28.6 pg (ref 26.0–34.0)
MCHC: 32.5 g/dL (ref 30.0–36.0)
MCV: 87.9 fL (ref 80.0–100.0)
Platelets: 216 K/uL (ref 150–400)
RBC: 5.04 MIL/uL (ref 3.87–5.11)
RDW: 14.2 % (ref 11.5–15.5)
WBC: 6.6 K/uL (ref 4.0–10.5)
nRBC: 0 % (ref 0.0–0.2)

## 2020-04-11 LAB — CBG MONITORING, ED: Glucose-Capillary: 88 mg/dL (ref 70–99)

## 2020-04-11 NOTE — ED Triage Notes (Addendum)
Pt arrived via EMS from home with c/o dizziness. Pt was seen at [facility] today with no findings. Pt to ED due to continued concern.  CBG 76

## 2020-04-12 ENCOUNTER — Emergency Department
Admission: EM | Admit: 2020-04-12 | Discharge: 2020-04-12 | Disposition: A | Discharge status: Home / Self Care | Payer: Medicare (Managed Care) | Attending: Emergency Medicine | Admitting: Emergency Medicine

## 2020-04-12 DIAGNOSIS — R42 Dizziness and giddiness: Secondary | ICD-10-CM

## 2020-04-12 LAB — BASIC METABOLIC PANEL
Anion gap: 10 (ref 5–15)
BUN: 15 mg/dL (ref 8–23)
CO2: 26 mmol/L (ref 22–32)
Calcium: 9.7 mg/dL (ref 8.9–10.3)
Chloride: 105 mmol/L (ref 98–111)
Creatinine, Ser: 0.76 mg/dL (ref 0.44–1.00)
GFR, Estimated: >60 mL/min (ref >60)
Glucose, Bld: 88 mg/dL (ref 70–99)
Potassium: 3.9 mmol/L (ref 3.5–5.1)
Sodium: 141 mmol/L (ref 135–145)

## 2020-04-12 LAB — TROPONIN I (HIGH SENSITIVITY)
Troponin I (High Sensitivity): 8 ng/L (ref <18)
Troponin I (High Sensitivity): 7 ng/L (ref <18)

## 2020-04-12 LAB — URINALYSIS, COMPLETE (UACMP) WITH MICROSCOPIC
Bacteria, UA: NONE SEEN
Bilirubin Urine: NEGATIVE
Glucose, UA: NEGATIVE mg/dL
Hgb urine dipstick: NEGATIVE
Ketones, ur: NEGATIVE mg/dL
Leukocytes,Ua: NEGATIVE
Nitrite: NEGATIVE
Protein, ur: NEGATIVE mg/dL
Specific Gravity, Urine: 1.014 (ref 1.005–1.030)
Squamous Epithelial / HPF: NONE SEEN (ref 0–5)
pH: 6 (ref 5.0–8.0)

## 2020-04-12 NOTE — ED Provider Notes (Signed)
[facility] Emergency Department Provider Note   ____________________________________________    I have reviewed the triage vital signs and the nursing notes.   HISTORY  Chief Complaint Dizziness     HPI Michelle Kaufman is a 75 y.o. female with CKD, hypercalcemia, hypertension who presents with complaints of dizziness.  Patient reports for more than a week she has had some lightheadedness when she first stands up.  This resolves rapidly and then she is able to ambulate without difficulty.  Last night she had some dizziness when she put her head on the pillow which she describes as more vertiginous in nature.  No neuro deficits.  No chest pain or palpitations.  No nausea vomiting or diaphoresis.  Feels quite well currently  Past Medical History:  Diagnosis Date  . Chronic kidney disease    CKD, Stage 3  . Hypercalcemia   . Hypertension   . Osteoporosis     There are no problems to display for this patient.   Past Surgical History:  Procedure Laterality Date  . COLONOSCOPY WITH PROPOFOL N/A 10/02/2014   Procedure: COLONOSCOPY WITH PROPOFOL;  Surgeon: Josefine Class, MD;  Location: [facility] ENDOSCOPY;  Service: Endoscopy;  Laterality: N/A;  . JOINT REPLACEMENT Right 2003    Prior to Admission medications   Medication Sig Start Date End Date Taking? Authorizing Provider  aspirin EC 81 MG tablet Take 81 mg by mouth daily.    [provider]  Cholecalciferol 1000 UNITS TBDP Take 1,000 Units by mouth daily.    [provider]  lisinopril (PRINIVIL,ZESTRIL) 10 MG tablet Take 10 mg by mouth daily.    [provider]     Allergies Patient has no known allergies.  Family History  Problem Relation Age of Onset  . Breast cancer Neg Hx     Social History Social History   Tobacco Use  . Smoking status: Current Every Day Smoker    Packs/day: 0.50    Types: Cigarettes  . Smokeless tobacco: Never Used    Review of  Systems  Constitutional: No fever/chills Eyes: No visual changes.  ENT: No sore throat. Cardiovascular: Denies chest pain. Respiratory: Denies shortness of breath. Gastrointestinal: No abdominal pain.  No nausea, no vomiting.   Genitourinary: Negative for dysuria. Musculoskeletal: Negative for back pain. Skin: Negative for rash. Neurological: As above   ____________________________________________   PHYSICAL EXAM:  VITAL SIGNS: ED Triage Vitals  Enc Vitals Group     BP 04/11/20 2202 119/71     Pulse Rate 04/11/20 2202 61     Resp 04/11/20 2202 18     Temp 04/11/20 2202 97.7 F (36.5 C)     Temp Source 04/11/20 2202 Oral     SpO2 04/11/20 2202 100 %     Weight --      Height --      Head Circumference --      Peak Flow --      Pain Score 04/12/20 0730 2     Pain Loc --      Pain Edu? --      Excl. in [place]? --     Constitutional: Alert and oriented. No acute distress. Pleasant and interactive  Nose: No congestion/rhinnorhea. Mouth/Throat: Mucous membranes are moist.    Cardiovascular: Normal rate, regular rhythm. Grossly normal heart sounds.  Good peripheral circulation. Respiratory: Normal respiratory effort.  No retractions. Lungs CTAB. Gastrointestinal: Soft and nontender. No distention.  No CVA tenderness.  Musculoskeletal:  No lower extremity tenderness nor edema.  Warm and well perfused Neurologic:  Normal speech and language. No gross focal neurologic deficits are appreciated.  Skin:  Skin is warm, dry and intact. No rash noted. Psychiatric: Mood and affect are normal. Speech and behavior are normal.  ____________________________________________   LABS (all labs ordered are listed, but only abnormal results are displayed)  Labs Reviewed  URINALYSIS, COMPLETE (UACMP) WITH MICROSCOPIC - Abnormal; Notable for the following components:      Result Value   Color, Urine YELLOW (*)    APPearance CLEAR (*)    All other components within normal limits  CBC   BASIC METABOLIC PANEL  CBG MONITORING, ED  TROPONIN I (HIGH SENSITIVITY)  TROPONIN I (HIGH SENSITIVITY)   ____________________________________________  EKG  ED ECG REPORT I, Lavonia Drafts, the attending physician, personally viewed and interpreted this ECG.  Date: 04/12/2020  Rhythm: normal sinus rhythm QRS Axis: normal Intervals: normal ST/T Wave abnormalities: normal Narrative Interpretation: no evidence of acute ischemia  ____________________________________________  RADIOLOGY  None ____________________________________________   PROCEDURES  Procedure(s) performed: No  Procedures   Critical Care performed: No ____________________________________________   INITIAL IMPRESSION / ASSESSMENT AND PLAN / ED COURSE  Pertinent labs & imaging results that were available during my care of the patient were reviewed by me and considered in my medical decision making (see chart for details).  Patient well-appearing and in no acute distress.  She is asymptomatic at this time.  No nystagmus noted on exam.  Delta troponin is normal.  Lab work is unremarkable,  Given dizziness when standing suspect element of orthostasis/dehydration.  Recommended IV fluids however patient refused that she would like to go home, she states she will increase her p.o. hydration.  She would like to follow-up with PCP and does not want any further work-up at this time    ____________________________________________   FINAL CLINICAL IMPRESSION(S) / ED DIAGNOSES  Final diagnoses:  Dizziness        Note:  This document was prepared using Dragon voice recognition software and may include unintentional dictation errors.   Lavonia Drafts, MD 04/12/20 [phone]

## 2020-04-27 ENCOUNTER — Telehealth: Payer: Self-pay

## 2020-04-27 NOTE — Telephone Encounter (Signed)
Patient lvm to confirm her office visit for tomorrow.  I attempted to call  Her back to confirm her appt tomorrow with Dr. Vicente Males  at 8:30am, however I was unable to reach her.  Thanks,  [place], [place]

## 2020-04-28 ENCOUNTER — Other Ambulatory Visit: Payer: Self-pay

## 2020-04-28 ENCOUNTER — Telehealth: Payer: Self-pay | Admitting: Gastroenterology

## 2020-04-28 ENCOUNTER — Encounter: Payer: Self-pay | Admitting: Gastroenterology

## 2020-04-28 ENCOUNTER — Ambulatory Visit: Payer: Medicare HMO | Admitting: Gastroenterology

## 2020-04-28 VITALS — BP 135/83 | HR 62 | Wt 104.2 lb

## 2020-04-28 DIAGNOSIS — Z860100 Personal history of colon polyps, unspecified: Secondary | ICD-10-CM | POA: Insufficient documentation

## 2020-04-28 DIAGNOSIS — R140 Abdominal distension (gaseous): Secondary | ICD-10-CM | POA: Diagnosis not present

## 2020-04-28 DIAGNOSIS — K5900 Constipation, unspecified: Secondary | ICD-10-CM

## 2020-04-28 MED ORDER — PEG 3350-KCL-NA BICARB-NACL 420 G PO SOLR: 4000.0000 mL | Freq: Once | ORAL | 0 refills | Status: AC

## 2020-04-28 MED ORDER — NA SULFATE-K SULFATE-MG SULF 17.5-3.13-1.6 GM/177ML PO SOLN: 1.0000 | Freq: Once | ORAL | 0 refills | Status: AC

## 2020-04-28 NOTE — Telephone Encounter (Signed)
New prep has been sent to the pharmacy.

## 2020-04-28 NOTE — Progress Notes (Signed)
Michelle Bellows MD, MRCP(U.K) [address]  [place]  [place], [place] [address]  Main: [phone]  Fax: [phone]   Gastroenterology Consultation  Referring Provider:     Marguerita Merles, MD Primary Care Physician:  Marguerita Merles, MD Primary Gastroenterologist:  Dr. Jonathon Kaufman  Reason for Consultation:     Abnormal weight loss        HPI:   Michelle Kaufman is a 75 y.o. y/o female referred for consultation & management  by Dr. Lennox Kaufman, Connye Burkitt, MD.    She was referred to see me about 6 months back in 10/31/2019 for abnormal weight loss.  Labs from January 2022: Hemoglobin 14.4 BMP normal urinalysis normal.  Chest x-ray from November 2021 shows scoliosis.  Last colonoscopy was in 2016 by Dr. Rayann Kaufman a 3 mm sigmoid colon polyp was resected which was a tubular adenoma.  Looking back at her weight in November 2021 the office note suggests her weight was 105 pounds.  And today at her office she weighed 104 pounds  She attests that she has lost weight but was not aware of her present weight or prior history.  Denies any abdominal pain but states she has some bloating.  She has history of on and off constipation and she takes over-the-counter medications for the same.  She states that this has been longstanding.  Denies any change in the shape of her stool.  Denies any rectal bleeding.  Denies any fevers. Past Medical History:  Diagnosis Date  . Chronic kidney disease    CKD, Stage 3  . Hypercalcemia   . Hypertension   . Osteoporosis     Past Surgical History:  Procedure Laterality Date  . COLONOSCOPY WITH PROPOFOL N/A 10/02/2014   Procedure: COLONOSCOPY WITH PROPOFOL;  Surgeon: Michelle Class, MD;  Location: [facility] ENDOSCOPY;  Service: Endoscopy;  Laterality: N/A;  . JOINT REPLACEMENT Right 2003    Prior to Admission medications   Medication Sig Start Date End Date Taking? Authorizing Provider  aspirin EC 81 MG tablet Take 81 mg by mouth daily.    [provider]   Cholecalciferol 1000 UNITS TBDP Take 1,000 Units by mouth daily.    [provider]  lisinopril (PRINIVIL,ZESTRIL) 10 MG tablet Take 10 mg by mouth daily.    [provider]    Family History  Problem Relation Age of Onset  . Breast cancer Neg Hx      Social History   Tobacco Use  . Smoking status: Current Every Day Smoker    Packs/day: 0.50    Types: Cigarettes  . Smokeless tobacco: Never Used    Allergies as of 04/28/2020  . (No Known Allergies)    Review of Systems:    All systems reviewed and negative except where noted in HPI.   Physical Exam:  BP 135/83 (BP Location: Left Arm, Patient Position: Sitting, Cuff Size: Normal)   Pulse 62   Wt 104 lb 3.2 oz (47.3 kg)   BMI 19.06 kg/m  No LMP recorded. Patient is postmenopausal. Psych:  Alert and cooperative. Normal mood and affect. General:   Alert,  Well-developed, well-nourished, pleasant and cooperative in NAD Head:  Normocephalic and atraumatic. Eyes:  Sclera clear, no icterus.   Conjunctiva pink. Ears:  Normal auditory acuity. Lungs:  Respirations even and unlabored.  Clear throughout to auscultation.   No wheezes, crackles, or rhonchi. No acute distress. Heart:  Regular rate and rhythm; no murmurs, clicks, rubs, or gallops.  Abdomen:  Normal bowel sounds.  No bruits.  Soft, non-tender and non-distended without masses, hepatosplenomegaly or hernias noted.  No guarding or rebound tenderness.    Neurologic:  Alert and oriented x3;  grossly normal neurologically. Psych:  Alert and cooperative. Normal mood and affect.  Imaging Studies: No results found.  Assessment and Plan:   Michelle Kaufman is a 75 y.o. y/o female has been referred for unintentional weight loss back in 10/31/2019.  Looking back at her records over the past 2 months there has been no weight loss she has remained stable around 104 pounds.  When I look back to November 2020 she also weighed 105 pounds at that time so I really cannot  see a true loss of weight from her records.  She does have some bloating and some constipation and I would presume that the bloating is secondary to constipation.  This has been longstanding and not recent.  With the concerns of some weight loss bloating and constipation would suggest further evaluation  Plan 1.  EGD to evaluate for bloating, colonoscopy to evaluate colon cancer screening with a prior history of colon polyp in 2016  2.  High-fiber diet: Patient information we provided  3.  Follow-up with Marguerita Merles, MD to monitor weight if concern for further weight loss would suggest CT chest abdomen and pelvis in view of history of smoking as a risk factor for neoplasm  I have discussed alternative options, risks & benefits,  which include, but are not limited to, bleeding, infection, perforation,respiratory complication & drug reaction.  The patient agrees with this plan & written consent will be obtained.     Follow up as needed  Dr Michelle Bellows MD,MRCP(U.K)

## 2020-04-28 NOTE — Telephone Encounter (Signed)
Chauncey called to ask if Dr Vicente Males would send in Golytely instead of Suprep, patient cannot afford the Suprep.

## 2020-04-28 NOTE — Patient Instructions (Signed)
High-Fiber Eating Plan Fiber, also called dietary fiber, is a type of carbohydrate. It is found foods such as fruits, vegetables, whole grains, and beans. A high-fiber diet can have many health benefits. Your health care provider may recommend a high-fiber diet to help:  Prevent constipation. Fiber can make your bowel movements more regular.  Lower your cholesterol.  Relieve the following conditions: ? Inflammation of veins in the anus (hemorrhoids). ? Inflammation of specific areas of the digestive tract (uncomplicated diverticulosis). ? A problem of the large intestine, also called the colon, that sometimes causes pain and diarrhea (irritable bowel syndrome, or IBS).  Prevent overeating as part of a weight-loss plan.  Prevent heart disease, type 2 diabetes, and certain cancers. What are tips for following this plan? Reading food labels  Check the nutrition facts label on food products for the amount of dietary fiber. Choose foods that have 5 grams of fiber or more per serving.  The goals for recommended daily fiber intake include: ? Men (age 50 or younger): 34-38 g. ? Men (over age 50): 28-34 g. ? Women (age 50 or younger): 25-28 g. ? Women (over age 50): 22-25 g. Your daily fiber goal is _____________ g.   Shopping  Choose whole fruits and vegetables instead of processed forms, such as apple juice or applesauce.  Choose a wide variety of high-fiber foods such as avocados, lentils, oats, and kidney beans.  Read the nutrition facts label of the foods you choose. Be aware of foods with added fiber. These foods often have high sugar and sodium amounts per serving. Cooking  Use whole-grain flour for baking and cooking.  Cook with brown rice instead of white rice. Meal planning  Start the day with a breakfast that is high in fiber, such as a cereal that contains 5 g of fiber or more per serving.  Eat breads and cereals that are made with whole-grain flour instead of refined  flour or white flour.  Eat brown rice, bulgur wheat, or millet instead of white rice.  Use beans in place of meat in soups, salads, and pasta dishes.  Be sure that half of the grains you eat each day are whole grains. General information  You can get the recommended daily intake of dietary fiber by: ? Eating a variety of fruits, vegetables, grains, nuts, and beans. ? Taking a fiber supplement if you are not able to take in enough fiber in your diet. It is better to get fiber through food than from a supplement.  Gradually increase how much fiber you consume. If you increase your intake of dietary fiber too quickly, you may have bloating, cramping, or gas.  Drink plenty of water to help you digest fiber.  Choose high-fiber snacks, such as berries, raw vegetables, nuts, and popcorn. What foods should I eat? Fruits Berries. Pears. Apples. Oranges. Avocado. Prunes and raisins. Dried figs. Vegetables Sweet potatoes. Spinach. Kale. Artichokes. Cabbage. Broccoli. Cauliflower. Green peas. Carrots. Squash. Grains Whole-grain breads. Multigrain cereal. Oats and oatmeal. Brown rice. Barley. Bulgur wheat. Millet. Quinoa. Bran muffins. Popcorn. Rye wafer crackers. Meats and other proteins Navy beans, kidney beans, and pinto beans. Soybeans. Split peas. Lentils. Nuts and seeds. Dairy Fiber-fortified yogurt. Beverages Fiber-fortified soy milk. Fiber-fortified orange juice. Other foods Fiber bars. The items listed above may not be a complete list of recommended foods and beverages. Contact a dietitian for more information. What foods should I avoid? Fruits Fruit juice. Cooked, strained fruit. Vegetables Fried potatoes. Canned vegetables. Well-cooked vegetables. Grains   White bread. Pasta made with refined flour. White rice. Meats and other proteins Fatty cuts of meat. Fried chicken or fried fish. Dairy Milk. Yogurt. Cream cheese. Sour cream. Fats and oils Butters. Beverages Soft  drinks. Other foods Cakes and pastries. The items listed above may not be a complete list of foods and beverages to avoid. Talk with your dietitian about what choices are best for you. Summary  Fiber is a type of carbohydrate. It is found in foods such as fruits, vegetables, whole grains, and beans.  A high-fiber diet has many benefits. It can help to prevent constipation, lower blood cholesterol, aid weight loss, and reduce your risk of heart disease, diabetes, and certain cancers.  Increase your intake of fiber gradually. Increasing fiber too quickly may cause cramping, bloating, and gas. Drink plenty of water while you increase the amount of fiber you consume.  The best sources of fiber include whole fruits and vegetables, whole grains, nuts, seeds, and beans. This information is not intended to replace advice given to you by your health care provider. Make sure you discuss any questions you have with your health care provider. Document Revised: 07/03/2019 Document Reviewed: 07/03/2019 Elsevier Patient Education  2021 Elsevier Inc.  

## 2020-05-04 ENCOUNTER — Telehealth: Payer: Self-pay

## 2020-05-04 NOTE — Telephone Encounter (Signed)
Spoke with patient to go over bowel prep instructions. Explained instructions to patient. Pt verbalized understanding.

## 2020-05-12 ENCOUNTER — Telehealth: Payer: Self-pay

## 2020-05-12 NOTE — Telephone Encounter (Signed)
Returned patients call. Pt had questions regarding bowel prep. Explained to patient the bowel prep instructions and clear liquid diet. Pt verbalized understanding.

## 2020-05-13 ENCOUNTER — Ambulatory Visit
Admission: RE | Admit: 2020-05-13 | Discharge: 2020-05-13 | Disposition: A | Discharge status: Home / Self Care | Payer: Medicare HMO | Source: Ambulatory Visit | Attending: Family Medicine | Admitting: Family Medicine

## 2020-05-13 ENCOUNTER — Other Ambulatory Visit: Payer: Self-pay

## 2020-05-13 DIAGNOSIS — Z1231 Encounter for screening mammogram for malignant neoplasm of breast: Secondary | ICD-10-CM | POA: Insufficient documentation

## 2020-05-18 ENCOUNTER — Other Ambulatory Visit
Admission: RE | Admit: 2020-05-18 | Discharge: 2020-05-18 | Disposition: A | Discharge status: Home / Self Care | Payer: Medicare HMO | Source: Ambulatory Visit | Attending: Gastroenterology | Admitting: Gastroenterology

## 2020-05-18 ENCOUNTER — Other Ambulatory Visit: Payer: Self-pay

## 2020-05-18 DIAGNOSIS — Z20822 Contact with and (suspected) exposure to covid-19: Secondary | ICD-10-CM | POA: Insufficient documentation

## 2020-05-18 DIAGNOSIS — Z01812 Encounter for preprocedural laboratory examination: Secondary | ICD-10-CM | POA: Insufficient documentation

## 2020-05-18 LAB — SARS CORONAVIRUS 2 (TAT 6-24 HRS): SARS Coronavirus 2: NEGATIVE

## 2020-05-19 ENCOUNTER — Encounter: Payer: Self-pay | Admitting: Gastroenterology

## 2020-05-20 ENCOUNTER — Encounter: Payer: Self-pay | Admitting: Gastroenterology

## 2020-05-20 ENCOUNTER — Encounter
Admission: RE | Disposition: A | Discharge status: Home / Self Care | Payer: Self-pay | Source: Home / Self Care | Attending: Gastroenterology

## 2020-05-20 ENCOUNTER — Ambulatory Visit: Payer: Medicare HMO | Admitting: Anesthesiology

## 2020-05-20 ENCOUNTER — Ambulatory Visit
Admission: RE | Admit: 2020-05-20 | Discharge: 2020-05-20 | Disposition: A | Discharge status: Home / Self Care | Payer: Medicare HMO | Attending: Gastroenterology | Admitting: Gastroenterology

## 2020-05-20 DIAGNOSIS — Z7982 Long term (current) use of aspirin: Secondary | ICD-10-CM | POA: Insufficient documentation

## 2020-05-20 DIAGNOSIS — D122 Benign neoplasm of ascending colon: Secondary | ICD-10-CM | POA: Diagnosis not present

## 2020-05-20 DIAGNOSIS — Z79899 Other long term (current) drug therapy: Secondary | ICD-10-CM | POA: Insufficient documentation

## 2020-05-20 DIAGNOSIS — Z86010 Personal history of colonic polyps: Secondary | ICD-10-CM | POA: Diagnosis not present

## 2020-05-20 DIAGNOSIS — Z1211 Encounter for screening for malignant neoplasm of colon: Secondary | ICD-10-CM | POA: Insufficient documentation

## 2020-05-20 DIAGNOSIS — R140 Abdominal distension (gaseous): Secondary | ICD-10-CM | POA: Diagnosis present

## 2020-05-20 DIAGNOSIS — I129 Hypertensive chronic kidney disease with stage 1 through stage 4 chronic kidney disease, or unspecified chronic kidney disease: Secondary | ICD-10-CM | POA: Insufficient documentation

## 2020-05-20 DIAGNOSIS — K635 Polyp of colon: Secondary | ICD-10-CM | POA: Diagnosis not present

## 2020-05-20 DIAGNOSIS — N183 Chronic kidney disease, stage 3 unspecified: Secondary | ICD-10-CM | POA: Diagnosis not present

## 2020-05-20 DIAGNOSIS — F17210 Nicotine dependence, cigarettes, uncomplicated: Secondary | ICD-10-CM | POA: Insufficient documentation

## 2020-05-20 HISTORY — PX: ESOPHAGOGASTRODUODENOSCOPY (EGD) WITH PROPOFOL: SHX5813

## 2020-05-20 HISTORY — PX: COLONOSCOPY WITH PROPOFOL: SHX5780

## 2020-05-20 SURGERY — COLONOSCOPY WITH PROPOFOL
Anesthesia: General

## 2020-05-20 MED ORDER — PROPOFOL 500 MG/50ML IV EMUL
INTRAVENOUS | Status: DC | PRN
  Administered 2020-05-20: 140 ug/kg/min via INTRAVENOUS

## 2020-05-20 MED ORDER — PROPOFOL 10 MG/ML IV BOLUS
INTRAVENOUS | Status: DC | PRN
  Administered 2020-05-20: 60 mg via INTRAVENOUS

## 2020-05-20 MED ORDER — SODIUM CHLORIDE 0.9 % IV SOLN: INTRAVENOUS | Status: DC

## 2020-05-20 MED ORDER — LIDOCAINE HCL (CARDIAC) PF 100 MG/5ML IV SOSY
PREFILLED_SYRINGE | INTRAVENOUS | Status: DC | PRN
  Administered 2020-05-20: 60 mg via INTRAVENOUS

## 2020-05-20 MED ORDER — SODIUM CHLORIDE (PF) 0.9 % IJ SOLN
INTRAMUSCULAR | Status: DC | PRN
  Administered 2020-05-20: 2 mL via INTRAVENOUS

## 2020-05-20 NOTE — Anesthesia Postprocedure Evaluation (Signed)
Anesthesia Post Note  Patient: Michelle Kaufman  Procedure(s) Performed: COLONOSCOPY WITH PROPOFOL (N/A ) ESOPHAGOGASTRODUODENOSCOPY (EGD) WITH PROPOFOL (N/A )  Patient location during evaluation: Endoscopy Anesthesia Type: General Level of consciousness: awake and alert Pain management: pain level controlled Vital Signs Assessment: post-procedure vital signs reviewed and stable Respiratory status: spontaneous breathing, nonlabored ventilation, respiratory function stable and patient connected to nasal cannula oxygen Cardiovascular status: blood pressure returned to baseline and stable Postop Assessment: no apparent nausea or vomiting Anesthetic complications: no   No complications documented.   Last Vitals:  Vitals:   05/20/20 1053 05/20/20 1103  BP:  (!) 157/85  Pulse:    Resp:    Temp:    SpO2: 95%     Last Pain:  Vitals:   05/20/20 1103  TempSrc:   PainSc: 0-No pain                 Martha Clan

## 2020-05-20 NOTE — Op Note (Signed)
[facility] Gastroenterology Patient Name: Michelle Kaufman Procedure Date: 05/20/2020 9:39 AM MRN: [id number] Account #: [id number] Date of Birth: 11/03/1945 Admit Type: Outpatient Age: 75 Room: [facility] ENDO ROOM 3 Gender: Female Note Status: Finalized Procedure:             Upper GI endoscopy Indications:           Abdominal bloating Providers:             [name] MD, MD Referring MD:          Marguerita Merles, MD (Referring MD) Medicines:             Monitored Anesthesia Care Complications:         No immediate complications. Procedure:             Pre-Anesthesia Assessment:                        - Prior to the procedure, a History and Physical was                         performed, and patient medications, allergies and                         sensitivities were reviewed. The patient's tolerance                         of previous anesthesia was reviewed.                        - The risks and benefits of the procedure and the                         sedation options and risks were discussed with the                         patient. All questions were answered and informed                         consent was obtained.                        - ASA Grade Assessment: II - A patient with mild                         systemic disease.                        After obtaining informed consent, the endoscope was                         passed under direct vision. Throughout the procedure,                         the patient's blood pressure, pulse, and oxygen                         saturations were monitored continuously. The Endoscope                         was introduced through the mouth, and  advanced to the                         third part of duodenum. The upper GI endoscopy was                         accomplished with ease. The patient tolerated the                         procedure well. Findings:      The esophagus was normal.      The examined duodenum was  normal.      Clear fluid was found in the gastric body.      The cardia and gastric fundus were normal on retroflexion. Impression:            - Normal esophagus.                        - Normal examined duodenum.                        - Clear gastric fluid.                        - No specimens collected. Recommendation:        - Perform a colonoscopy today. Procedure Code(s):     --- Professional ---                        [phone], Esophagogastroduodenoscopy, flexible,                         transoral; diagnostic, including collection of                         specimen(s) by brushing or washing, when performed                         (separate procedure) Diagnosis Code(s):     --- Professional ---                        R14.0, Abdominal distension (gaseous) CPT copyright 2019 American Medical Association. All rights reserved. The codes documented in this report are preliminary and upon coder review may  be revised to meet current compliance requirements. [name], MD [name] MD, MD 05/20/2020 9:55:41 AM This report has been signed electronically. Number of Addenda: 0 Note Initiated On: 05/20/2020 9:39 AM Estimated Blood Loss:  Estimated blood loss: none.      [facility]

## 2020-05-20 NOTE — H&P (Signed)
Michelle Bellows, MD [address], [place], [place], [place], [address] [address], [place], [place], [place], [address] Phone: [phone]  Fax: [phone]  Primary Care Physician:  Marguerita Merles, MD   Pre-Procedure History & Physical: HPI:  Michelle Kaufman is a 75 y.o. female is here for an endoscopy and colonoscopy    Past Medical History:  Diagnosis Date  . Chronic kidney disease    CKD, Stage 3  . Hypercalcemia   . Hypertension   . Osteoporosis     Past Surgical History:  Procedure Laterality Date  . COLONOSCOPY WITH PROPOFOL N/A 10/02/2014   Procedure: COLONOSCOPY WITH PROPOFOL;  Surgeon: Josefine Class, MD;  Location: [facility] ENDOSCOPY;  Service: Endoscopy;  Laterality: N/A;  . JOINT REPLACEMENT Right 2003    Prior to Admission medications   Medication Sig Start Date End Date Taking? Authorizing Provider  Cholecalciferol 1000 UNITS TBDP Take 1,000 Units by mouth daily.   Yes [provider]  aspirin EC 81 MG tablet Take 81 mg by mouth daily. Patient not taking: Reported on 05/20/2020    [provider]  cephALEXin (KEFLEX) 500 MG capsule cephalexin 500 mg capsule  TAKE 1 CAPSULE BY MOUTH EVERY 6 HOURS Patient not taking: Reported on 05/20/2020    [provider]  lactulose, encephalopathy, (CHRONULAC) 10 GM/15ML SOLN Take 15 ml by mouth twice a day  Ina 04/12/20   [provider]  lisinopril (PRINIVIL,ZESTRIL) 10 MG tablet Take 10 mg by mouth daily. Patient not taking: Reported on 05/20/2020    [provider]    Allergies as of 04/28/2020  . (No Known Allergies)    Family History  Problem Relation Age of Onset  . Breast cancer Neg Hx     Social History   Socioeconomic History  . Marital status: Widowed    Spouse name: Not on file  . Number of children: Not on file  . Years of education: Not on file  . Highest education level: Not on file  Occupational History  . Not on file   Tobacco Use  . Smoking status: Current Every Day Smoker    Packs/day: 0.50    Types: Cigarettes  . Smokeless tobacco: Never Used  Vaping Use  . Vaping Use: Never used  Substance and Sexual Activity  . Alcohol use: Never  . Drug use: Never  . Sexual activity: Not on file  Other Topics Concern  . Not on file  Social History Narrative  . Not on file   Social Determinants of Health   Financial Resource Strain: Not on file  Food Insecurity: Not on file  Transportation Needs: Not on file  Physical Activity: Not on file  Stress: Not on file  Social Connections: Not on file  Intimate Partner Violence: Not on file    Review of Systems: See HPI, otherwise negative ROS  Physical Exam: BP 125/75   Pulse 73   Temp (!) 97.1 F (36.2 C) (Temporal)   Resp 18   Ht 5\' 1"  (1.549 m)   Wt 47.6 kg   SpO2 100%   BMI 19.84 kg/m  General:   Alert,  pleasant and cooperative in NAD Head:  Normocephalic and atraumatic. Neck:  Supple; no masses or thyromegaly. Lungs:  Clear throughout to auscultation, normal respiratory effort.    Heart:  +S1, +S2, Regular rate and rhythm, No edema. Abdomen:  Soft, nontender and nondistended. Normal bowel sounds, without guarding, and without rebound.  Neurologic:  Alert and  oriented x4;  grossly normal neurologically.  Impression/Plan: Michelle Kaufman is here for an endoscopy and colonoscopy  to be performed for  evaluation of bloating and colon cancer screening    Risks, benefits, limitations, and alternatives regarding endoscopy have been reviewed with the patient.  Questions have been answered.  All parties agreeable.   Michelle Bellows, MD  05/20/2020, 9:19 AM

## 2020-05-20 NOTE — Op Note (Signed)
[facility] Gastroenterology Patient Name: Michelle Kaufman Procedure Date: 05/20/2020 9:13 AM MRN: [id number] Account #: [id number] Date of Birth: 27-Jun-1945 Admit Type: Outpatient Age: 74 Room: [facility] ENDO ROOM 3 Gender: Female Note Status: Finalized Procedure:             Colonoscopy Indications:           Screening for colorectal malignant neoplasm Providers:             [name] MD, MD Referring MD:          Marguerita Merles, MD (Referring MD) Medicines:             Monitored Anesthesia Care Complications:         No immediate complications. Procedure:             Pre-Anesthesia Assessment:                        - Prior to the procedure, a History and Physical was                         performed, and patient medications, allergies and                         sensitivities were reviewed. The patient's tolerance                         of previous anesthesia was reviewed.                        - The risks and benefits of the procedure and the                         sedation options and risks were discussed with the                         patient. All questions were answered and informed                         consent was obtained.                        - ASA Grade Assessment: II - A patient with mild                         systemic disease.                        After obtaining informed consent, the colonoscope was                         passed under direct vision. Throughout the procedure,                         the patient's blood pressure, pulse, and oxygen                         saturations were monitored continuously. The                         Colonoscope was introduced through the anus  and                         advanced to the the cecum, identified by the                         appendiceal orifice. The colonoscopy was performed                         without difficulty. The patient tolerated the                         procedure well. The  quality of the bowel preparation                         was excellent. Findings:      The perianal and digital rectal examinations were normal.      Three sessile polyps were found in the ascending colon. The polyps were       4 to 6 mm in size. These polyps were removed with a cold snare.       Resection and retrieval were complete.      Two semi-pedunculated polyps were found in the ascending colon. The       polyps were 7 to 9 mm in size. These polyps were removed with a hot       snare. Resection and retrieval were complete.      A 15 mm polyp was found in the ascending colon. The polyp was sessile.       Preparations were made for mucosal resection. blue light was done to       mark the borders of the lesion. aline injection was performed for sub       mucosal elevation and resected with hot snare. To prevent bleeding after       the polypectomy, one hemostatic clip was successfully placed. There was       no bleeding during, or at the end, of the procedure.      The exam was otherwise without abnormality on direct and retroflexion       views. Impression:            - Three 4 to 6 mm polyps in the ascending colon,                         removed with a cold snare. Resected and retrieved.                        - Two 7 to 9 mm polyps in the ascending colon, removed                         with a hot snare. Resected and retrieved.                        - One 15 mm polyp in the ascending colon, removed with                         mucosal resection. Resected and retrieved. Clip was                         placed.                        -  The examination was otherwise normal on direct and                         retroflexion views.                        - Mucosal resection was performed. Resection and                         retrieval were complete. Recommendation:        - Discharge patient to home (with escort).                        - Resume previous diet.                         - Continue present medications.                        - Await pathology results.                        - Repeat colonoscopy for surveillance based on                         pathology results. Procedure Code(s):     --- Professional ---                        [phone], Colonoscopy, flexible; with removal of                         tumor(s), polyp(s), or other lesion(s) by snare                         technique Diagnosis Code(s):     --- Professional ---                        Z12.11, Encounter for screening for malignant neoplasm                         of colon                        K63.5, Polyp of colon CPT copyright 2019 American Medical Association. All rights reserved. The codes documented in this report are preliminary and upon coder review may  be revised to meet current compliance requirements. [name], MD [name] MD, MD 05/20/2020 10:35:24 AM This report has been signed electronically. Number of Addenda: 0 Note Initiated On: 05/20/2020 9:13 AM Scope Withdrawal Time: 0 hours 25 minutes 36 seconds  Total Procedure Duration: 0 hours 32 minutes 41 seconds  Estimated Blood Loss:  Estimated blood loss: none.      [facility]

## 2020-05-20 NOTE — Transfer of Care (Signed)
Immediate Anesthesia Transfer of Care Note  Patient: Michelle Kaufman  Procedure(s) Performed: COLONOSCOPY WITH PROPOFOL (N/A ) ESOPHAGOGASTRODUODENOSCOPY (EGD) WITH PROPOFOL (N/A )  Patient Location: PACU  Anesthesia Type:General  Level of Consciousness: sedated  Airway & Oxygen Therapy: Patient Spontanous Breathing  Post-op Assessment: Report given to RN and Post -op Vital signs reviewed and stable  Post vital signs: Reviewed and stable  Last Vitals:  Vitals Value Taken Time  BP    Temp    Pulse 61 05/20/20 1033  Resp 26 05/20/20 1033  SpO2 95 % 05/20/20 1033  Vitals shown include unvalidated device data.  Last Pain:  Vitals:   05/20/20 0846  TempSrc: Temporal  PainSc: 0-No pain         Complications: No complications documented.

## 2020-05-20 NOTE — Anesthesia Preprocedure Evaluation (Signed)
Anesthesia Evaluation  Patient identified by MRN, date of birth, ID band Patient awake    Reviewed: Allergy & Precautions, H&P , NPO status , Patient's Chart, lab work & pertinent test results, reviewed documented beta blocker date and time   History of Anesthesia Complications Negative for: history of anesthetic complications  Airway Mallampati: II  TM Distance: >3 FB Neck ROM: full    Dental  (+) Partial Lower, Upper Dentures, Dental Advidsory Given   Pulmonary neg shortness of breath, neg COPD, neg recent URI, Current Smoker and Patient abstained from smoking.,    Pulmonary exam normal breath sounds clear to auscultation       Cardiovascular Exercise Tolerance: Good negative cardio ROS Normal cardiovascular exam Rhythm:regular Rate:Normal     Neuro/Psych negative neurological ROS  negative psych ROS   GI/Hepatic negative GI ROS, Neg liver ROS,   Endo/Other  negative endocrine ROS  Renal/GU CRFRenal disease  negative genitourinary   Musculoskeletal   Abdominal   Peds  Hematology negative hematology ROS (+)   Anesthesia Other Findings Past Medical History: No date: Chronic kidney disease     Comment:  CKD, Stage 3 No date: Hypercalcemia No date: Hypertension No date: Osteoporosis   Reproductive/Obstetrics negative OB ROS                             Anesthesia Physical Anesthesia Plan  ASA: II  Anesthesia Plan: General   Post-op Pain Management:    Induction: Intravenous  PONV Risk Score and Plan: 2 and TIVA and Propofol infusion  Airway Management Planned: Natural Airway and Nasal Cannula  Additional Equipment:   Intra-op Plan:   Post-operative Plan:   Informed Consent: I have reviewed the patients History and Physical, chart, labs and discussed the procedure including the risks, benefits and alternatives for the proposed anesthesia with the patient or authorized  representative who has indicated his/her understanding and acceptance.     Dental Advisory Given  Plan Discussed with: Anesthesiologist, CRNA and Surgeon  Anesthesia Plan Comments:         Anesthesia Quick Evaluation

## 2020-05-21 ENCOUNTER — Encounter: Payer: Self-pay | Admitting: Gastroenterology

## 2020-05-21 LAB — SURGICAL PATHOLOGY

## 2020-05-24 ENCOUNTER — Encounter: Payer: Self-pay | Admitting: Gastroenterology

## 2020-05-27 ENCOUNTER — Telehealth: Payer: Self-pay | Admitting: Gastroenterology

## 2020-05-27 NOTE — Telephone Encounter (Signed)
Patient called asking about results.  She received her paperwork and does not understand what it says.  Please call

## 2020-06-01 NOTE — Telephone Encounter (Signed)
Pt has been notified of results and Dr. Anna's recommendations. 

## 2020-12-03 DIAGNOSIS — I071 Rheumatic tricuspid insufficiency: Secondary | ICD-10-CM | POA: Insufficient documentation

## 2020-12-08 ENCOUNTER — Ambulatory Visit: Payer: Medicare HMO | Admitting: Gastroenterology

## 2020-12-08 ENCOUNTER — Encounter: Payer: Self-pay | Admitting: Gastroenterology

## 2020-12-08 ENCOUNTER — Other Ambulatory Visit: Payer: Self-pay

## 2020-12-08 VITALS — BP 125/77 | HR 81 | Temp 97.8°F | Wt 105.0 lb

## 2020-12-08 DIAGNOSIS — R140 Abdominal distension (gaseous): Secondary | ICD-10-CM | POA: Diagnosis not present

## 2020-12-08 DIAGNOSIS — K5900 Constipation, unspecified: Secondary | ICD-10-CM

## 2020-12-08 NOTE — Progress Notes (Signed)
Michelle Bellows MD, MRCP(U.K) [address]  [place]  [place], [place] [address]  Main: [phone]  Fax: [phone]   Primary Care Physician: Marguerita Merles, MD  Primary Gastroenterologist:  Dr. Jonathon Kaufman   Chief complaint follow-up for IBS.   HPI: Michelle Kaufman is a 75 y.o. female   Summary of history :  Initially referred and seen in February 2022 for abnormal weight loss.Labs from January 2022: Hemoglobin 14.4 BMP normal urinalysis normal.  Chest x-ray from November 2021 shows scoliosis.  Last colonoscopy was in 2016 by Dr. Rayann Heman a 3 mm sigmoid colon polyp was resected which was a tubular adenoma. As per her record she has not lost any weight.  Denies any abdominal pain but states she has some bloating.  She has history of on and off constipation and she takes over-the-counter medications for the same.  She states that this has been longstanding.  Denies any change in the shape of her stool.  Denies any rectal bleeding.  Denies any fevers.  Interval history   04/28/2020-12/08/2020  05/20/2020: Colonoscopy: 2 polyps 7 to 10 mm resected in the ascending colon and 2 other polyps 4 to 6 mm also resected in the same area.  A 15 mm polyp was also resected in the ascending colon an upper endoscopy was performed on the same day showed no abnormality except for gastric fluid.  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  She takes lactulose 15 cc twice daily but it causes sometimes diarrhea sometimes does not work.  She prefers to take a pill.  When she does not have a good bowel movement has a lot of bloating and abdominal gas.  She has been started on dicyclomine for the same.  No other complaints.  Current Outpatient Medications  Medication Sig Dispense Refill   dicyclomine (BENTYL) 10 MG capsule Take 10 mg by mouth 4 (four) times daily -  before meals and at  bedtime.     lactulose, encephalopathy, (CHRONULAC) 10 GM/15ML SOLN Take 15 ml by mouth twice a day  UNTIL BOWEL MOVEMENT     No current facility-administered medications for this visit.    Allergies as of 12/08/2020   (No Known Allergies)    ROS:  General: Negative for anorexia, weight loss, fever, chills, fatigue, weakness. ENT: Negative for hoarseness, difficulty swallowing , nasal congestion. CV: Negative for chest pain, angina, palpitations, dyspnea on exertion, peripheral edema.  Respiratory: Negative for dyspnea at rest, dyspnea on exertion, cough, sputum, wheezing.  GI: See history of present illness. GU:  Negative for dysuria, hematuria, urinary incontinence, urinary frequency, nocturnal urination.  Endo: Negative for unusual weight change.    Physical Examination:   BP 125/77   Pulse 81   Temp 97.8 F (36.6 C) (Oral)   Wt 105 lb (47.6 kg)   BMI 19.84 kg/m   General: Well-nourished, well-developed in no acute distress.  Eyes: No icterus. Conjunctivae pink. Mouth: Oropharyngeal mucosa moist and pink , no lesions erythema or exudate. Lungs: Clear to auscultation bilaterally. Non-labored. Heart: Regular rate and rhythm, no murmurs rubs or gallops.  Abdomen: Bowel sounds are normal, nontender, nondistended, no hepatosplenomegaly or masses, no abdominal bruits or hernia , no rebound or guarding.   Extremities: No lower extremity edema. No clubbing or deformities. Neuro: Alert and oriented x 3.  Grossly intact. Skin: Warm and  dry, no jaundice.   Psych: Alert and cooperative, normal mood and affect.   Imaging Studies: No results found.  Assessment and Plan:   Michelle Kaufman is a 75 y.o. y/o female here to follow-up for unintentional weight loss back in 10/31/2019.  Since last visit the weight has been stable since 105 pounds.  No true loss of weight noted per records.   She does have some bloating and some constipation at this time and lactulose can cause a lot of  bloating due to the nonabsorbable sugars which can be fermented by the colonic and small bowel bacteria.  The dicyclomine may be contributing to worsening of constipation   Plan 1.   High-fiber diet: Patient information we provided 2.  Stop lactulose and dicyclomine, commence on Linzess 72 mcg.  I have given her 1 week sample.  She should call me in a week and if it works I will give her a 90-day prescription with 3 refills.  If it does not work we will plan to increase the dose to 145 mcg a day and reassess in a week and if needed increase to full dose of 290 mcg a day.     Dr Michelle Bellows  MD,MRCP [facility]) Follow up in as needed

## 2020-12-08 NOTE — Patient Instructions (Signed)
Please give [place] a call if you feel that Linzess 72 mcg works for you in a weeks time.

## 2020-12-15 ENCOUNTER — Telehealth: Payer: Self-pay | Admitting: Gastroenterology

## 2020-12-15 MED ORDER — LINACLOTIDE 290 MCG PO CAPS: 290.0000 ug | ORAL_CAPSULE | Freq: Every day | ORAL | 2 refills | Status: DC

## 2020-12-15 NOTE — Telephone Encounter (Signed)
Pt. Says Linzess is giving her gas. She requests a call back to see if she can go back to the other medicine

## 2020-12-15 NOTE — Telephone Encounter (Signed)
Patient called stating that Linzess 145 mcg was not helping her much and therefore, she wanted her prescription to go up to Linzess 290 mcg as told by provider. I then looked into his chart and it stated that the patient could go up to Linzess 290 mcg daily.

## 2021-04-04 ENCOUNTER — Other Ambulatory Visit: Payer: Self-pay | Admitting: Family Medicine

## 2021-04-04 DIAGNOSIS — Z1231 Encounter for screening mammogram for malignant neoplasm of breast: Secondary | ICD-10-CM

## 2021-04-18 ENCOUNTER — Telehealth: Payer: Self-pay

## 2021-04-18 NOTE — Telephone Encounter (Signed)
Patient states she has some questions about what to eat

## 2021-04-19 NOTE — Telephone Encounter (Signed)
Called patient back and she wanted to know if she was able to come in sooner that 05/03/2021 to see Dr. Vicente Males. I then offered her to come inn on 04/26/2021 at 1:15 PM and she agreed. I will be changing her appointment.

## 2021-04-26 ENCOUNTER — Other Ambulatory Visit: Payer: Self-pay

## 2021-04-26 ENCOUNTER — Ambulatory Visit (INDEPENDENT_AMBULATORY_CARE_PROVIDER_SITE_OTHER): Payer: Medicare HMO | Admitting: Gastroenterology

## 2021-04-26 ENCOUNTER — Encounter: Payer: Self-pay | Admitting: Gastroenterology

## 2021-04-26 VITALS — BP 118/78 | HR 65 | Temp 97.3°F | Wt 101.2 lb

## 2021-04-26 DIAGNOSIS — K5900 Constipation, unspecified: Secondary | ICD-10-CM

## 2021-04-26 MED ORDER — TRULANCE 3 MG PO TABS: 1.0000 | ORAL_TABLET | Freq: Every day | ORAL | 3 refills | Status: DC

## 2021-04-26 NOTE — Progress Notes (Signed)
[name] MD, MRCP(U.K) [address]  [place]  [place], [place] [address]  Main: [phone]  Fax: [phone]   Primary Care Physician: Marguerita Merles, MD  Primary Gastroenterologist:  Dr. [name]   Chief Complaint  Patient presents with   Constipation    HPI: Michelle Kaufman is a 76 y.o. female   Summary of history :   Initially referred and seen in February 2022  and was seen for bloating likely secondary to constipation . Initially some concern for weight loss but had been stable .  05/20/2020: Colonoscopy: 2 polyps 7 to 10 mm resected in the ascending colon and 2 other polyps 4 to 6 mm also resected in the same area.  A 15 mm polyp was also resected in the ascending colon an upper endoscopy was performed on the same day showed no abnormality except for gastric fluid.  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  She states that after starting the Linzess started having diarrhea.  She is not happy with the results of Linzess.  Would like to try different medication.  Otherwise she is doing well.    Current Outpatient Medications  Medication Sig Dispense Refill   CALCIUM 600/VITAMIN D 600-10 MG-MCG TABS Take 1 tablet by mouth 2 (two) times daily.     dicyclomine (BENTYL) 10 MG capsule Take 10 mg by mouth 4 (four) times daily -  before meals and at bedtime.     lactulose, encephalopathy, (CHRONULAC) 10 GM/15ML SOLN Take 15 ml by mouth twice a day  UNTIL BOWEL MOVEMENT     doxycycline (VIBRAMYCIN) 100 MG capsule Take 100 mg by mouth 2 (two) times daily. (Patient not taking: Reported on 04/26/2021)     LINZESS 145 MCG CAPS capsule Take 145 mcg by mouth daily. (Patient not taking: Reported on 04/26/2021)     No current facility-administered medications for this visit.    Allergies as of 04/26/2021    (No Known Allergies)    ROS:  General: Negative for anorexia, weight loss, fever, chills, fatigue, weakness. ENT: Negative for hoarseness, difficulty swallowing , nasal congestion. CV: Negative for chest pain, angina, palpitations, dyspnea on exertion, peripheral edema.  Respiratory: Negative for dyspnea at rest, dyspnea on exertion, cough, sputum, wheezing.  GI: See history of present illness. GU:  Negative for dysuria, hematuria, urinary incontinence, urinary frequency, nocturnal urination.  Endo: Negative for unusual weight change.    Physical Examination:   BP 118/78    Pulse 65    Temp (!) 97.3 F (36.3 C) (Oral)    Wt 101 lb 3.2 oz (45.9 kg)    BMI 19.12 kg/m   General: Well-nourished, well-developed in no acute distress.  Eyes: No icterus. Conjunctivae pink. Mouth: Oropharyngeal mucosa moist and pink , no lesions erythema or exudate. Neuro: Alert and oriented x 3.  Grossly intact. Skin: Warm and dry, no jaundice.   Psych: Alert and cooperative, normal mood and affect.   Imaging Studies: No results found.  Assessment and Plan:   Michelle Kaufman is a 76 y.o. y/o female here to follow-up for unintentional weight loss back in 10/31/2019.  Tried Linzess 290 mcg causing diarrhea.  Ideally would like to try samples of Trulance or Amitiza or Motegrity which we do not have any at this point of  time.  Hence I gave her the option to try Linzess 145 +72 mcg tablets which would be a lower dose than the 290 mcg that caused her to have diarrhea.    Dr [name]  MD,MRCP [facility]) Follow up in as needed

## 2021-05-03 ENCOUNTER — Ambulatory Visit: Payer: Medicare HMO | Admitting: Gastroenterology

## 2021-05-16 ENCOUNTER — Ambulatory Visit
Admission: RE | Admit: 2021-05-16 | Discharge: 2021-05-16 | Disposition: A | Discharge status: Home / Self Care | Payer: Medicare HMO | Source: Ambulatory Visit | Attending: Family Medicine | Admitting: Family Medicine

## 2021-05-16 ENCOUNTER — Telehealth: Payer: Self-pay | Admitting: Gastroenterology

## 2021-05-16 ENCOUNTER — Other Ambulatory Visit: Payer: Self-pay

## 2021-05-16 DIAGNOSIS — Z1231 Encounter for screening mammogram for malignant neoplasm of breast: Secondary | ICD-10-CM | POA: Diagnosis not present

## 2021-05-16 NOTE — Telephone Encounter (Signed)
Pt is requesting to speak with someone about the dosage of linzess. ?

## 2021-05-19 NOTE — Telephone Encounter (Signed)
Pt would like to get a refill on linzess she stated that she has taken her last one and also she has a question about the dosage. Her Pharmacy is [facility] ?

## 2021-05-27 MED ORDER — LINACLOTIDE 72 MCG PO CAPS
72.0000 ug | ORAL_CAPSULE | Freq: Every day | ORAL | 3 refills | Status: DC
Start: 2021-05-27 — End: 2021-09-20

## 2021-05-27 NOTE — Telephone Encounter (Signed)
Patient was contacted again and she wanted me to send her a prescription for Linzess 72 mcg sent to her pharmacy. ?

## 2021-05-27 NOTE — Addendum Note (Signed)
Addended by: Wayna Chalet on: 05/27/2021 09:04 AM ? ? Modules accepted: Orders ? ?

## 2021-06-09 ENCOUNTER — Telehealth: Payer: Self-pay | Admitting: Gastroenterology

## 2021-06-09 NOTE — Telephone Encounter (Signed)
Patient states she is having some problems, requesting to speak to nurse and ask some questions.  ?

## 2021-06-10 NOTE — Telephone Encounter (Signed)
Called patient back but she did not answer. I will call her again at a later time. ?

## 2021-06-13 NOTE — Telephone Encounter (Signed)
Called patient back and she stated that now Linzess 72 mcg is causing for it to give her diarrhea and not able to go out anywhere because she doesn't want to do herself. Therefore, patient wants to know if she could take the Linzess at night time and every other night? Patient wants to know if she could try that. Please advise.  ?

## 2021-06-16 NOTE — Telephone Encounter (Signed)
Tried to call patient but mailbox is full  

## 2021-06-20 NOTE — Telephone Encounter (Signed)
LVM for pt to return my call regarding issue with Linzess 70mg.  ? ?

## 2021-06-21 NOTE — Telephone Encounter (Signed)
PT left message with alternate phone number [phone] returned call concerning her medication linzess ?

## 2021-06-23 NOTE — Telephone Encounter (Signed)
Called and left a message for call back  

## 2021-06-24 NOTE — Telephone Encounter (Signed)
Pt is calling back regarding issues with meds, pls call [phone] ?

## 2021-06-24 NOTE — Telephone Encounter (Signed)
Patient states that she is doing the Linzess every other day like we recommended and she states she takes them a hour before she eats anything. She states that she has loose stools after she eats breakfast one time when she takes the [place].  ?

## 2021-06-27 NOTE — Telephone Encounter (Signed)
Linzess 72 ?

## 2021-06-27 NOTE — Telephone Encounter (Signed)
Patient verbalized understanding of instructions  

## 2021-06-27 NOTE — Telephone Encounter (Signed)
She is still having lose stools when she takes the [place]  ?

## 2021-07-14 ENCOUNTER — Other Ambulatory Visit: Payer: Self-pay

## 2021-07-14 ENCOUNTER — Telehealth: Payer: Self-pay

## 2021-07-14 MED ORDER — DICYCLOMINE HCL 10 MG PO CAPS: 10.0000 mg | ORAL_CAPSULE | Freq: Three times a day (TID) | ORAL | 0 refills | Status: DC

## 2021-07-14 NOTE — Telephone Encounter (Signed)
Called patient and gave her Dr. Georgeann Oppenheim recommendations and she stated that she would continue to do the Miralax 17 g BID as told by Dr. Lennox Grumbles (PCP) because she feels that Linzess is too much for her. She stated that she had taken the lowest dose of Linzess but it still caused her to have diarrhea. Therefore, she will take Miralax and will see how it goes. I also told her that Dr. Vicente Males will send her Bentyl for abdominal cramps and to take three times a day as needed. Patient agreed and had no further questions. ?

## 2021-07-14 NOTE — Telephone Encounter (Signed)
Patient called stating that she continues to have constipation and bloating in her stomach. Patient stated that at first Dr. Vicente Males had given him Linzess to try. She took them and it helped her some but then it stopped helping her. She then stated that she called [place] back and we recommended Miralax for her to take 58 G of Miralax once a day. She stated that it helps her use the restroom but she wanted to know if she needed to continue taking it because she feels that it helps her with her constipation but continues to have abdominal cramping and bloating at times.  ?Dr. Vicente Males, is there anything that the patient could try to do? ?

## 2021-08-09 ENCOUNTER — Other Ambulatory Visit: Payer: Self-pay

## 2021-08-09 DIAGNOSIS — K5900 Constipation, unspecified: Secondary | ICD-10-CM

## 2021-08-09 DIAGNOSIS — R140 Abdominal distension (gaseous): Secondary | ICD-10-CM

## 2021-08-09 DIAGNOSIS — R1084 Generalized abdominal pain: Secondary | ICD-10-CM

## 2021-08-09 MED ORDER — DICYCLOMINE HCL 10 MG PO CAPS: 10.0000 mg | ORAL_CAPSULE | Freq: Three times a day (TID) | ORAL | 1 refills | Status: DC

## 2021-08-09 MED ORDER — DICYCLOMINE HCL 10 MG PO CAPS: 10.0000 mg | ORAL_CAPSULE | Freq: Three times a day (TID) | ORAL | 0 refills | Status: DC

## 2021-08-09 NOTE — Telephone Encounter (Signed)
Patient called stating that she continues to have generalized abdominal pain and cramps. Patient stated that she feels better when she takes Bentyl but has ran out and she would like a refill. Patient also stated that her diarrhea has gotten better. She is also taking Miralax daily two times a day to help her stay regulated. Patient asked if she could have some type of imaging done since she is still having issues with her "stomach". Please advise.

## 2021-08-09 NOTE — Addendum Note (Signed)
Addended by: Wayna Chalet on: 08/09/2021 04:43 PM   Modules accepted: Orders

## 2021-08-15 ENCOUNTER — Telehealth: Payer: Self-pay | Admitting: Gastroenterology

## 2021-08-15 ENCOUNTER — Ambulatory Visit
Admission: RE | Admit: 2021-08-15 | Discharge: 2021-08-15 | Disposition: A | Discharge status: Home / Self Care | Payer: Medicare HMO | Source: Ambulatory Visit | Attending: Gastroenterology | Admitting: Gastroenterology

## 2021-08-15 DIAGNOSIS — R140 Abdominal distension (gaseous): Secondary | ICD-10-CM | POA: Diagnosis present

## 2021-08-15 DIAGNOSIS — R1084 Generalized abdominal pain: Secondary | ICD-10-CM | POA: Insufficient documentation

## 2021-08-15 LAB — POCT I-STAT CREATININE: Creatinine, Ser: 1.3 mg/dL — ABNORMAL HIGH (ref 0.44–1.00)

## 2021-08-15 MED ORDER — IOHEXOL 300 MG/ML  SOLN: 100.0000 mL | Freq: Once | INTRAMUSCULAR | Status: DC | PRN

## 2021-08-15 MED ORDER — IOHEXOL 300 MG/ML  SOLN
80.0000 mL | Freq: Once | INTRAMUSCULAR | Status: AC | PRN
  Administered 2021-08-15: 80 mL via INTRAVENOUS

## 2021-08-15 NOTE — Telephone Encounter (Signed)
Patient requesting a call back in reference to her CT scan.

## 2021-08-16 NOTE — Telephone Encounter (Signed)
Called patient back to let her know that her CT Scan was normal. Patient understood. Patient stated that she will continue to take Miralax twice daily.

## 2021-08-22 NOTE — Progress Notes (Signed)
Shows constipation - enqyire if she is taking anything for this and what dose

## 2021-08-22 NOTE — Progress Notes (Signed)
Inform creatinine was a bit elevated 7 days back- suggest to have it rechecked

## 2021-08-23 ENCOUNTER — Telehealth: Payer: Self-pay

## 2021-08-23 DIAGNOSIS — R7989 Other specified abnormal findings of blood chemistry: Secondary | ICD-10-CM

## 2021-08-23 NOTE — Telephone Encounter (Signed)
Called patient but had to leave her a detailed message letting her know that her creatinine level was elevated and that Dr. Vicente Males would like to repeat it. I told her that she is able to come to our office or at a [facility] location to have it drawn. I left her my number in case she had any questions.

## 2021-08-23 NOTE — Telephone Encounter (Signed)
-----   Message from [name], MD sent at 08/22/2021  9:40 AM EDT ----- Inform creatinine was a bit elevated 7 days back- suggest to have it rechecked

## 2021-08-24 ENCOUNTER — Telehealth: Payer: Self-pay

## 2021-08-24 MED ORDER — LACTULOSE 10 GM/15ML PO SOLN: ORAL | 11 refills | Status: DC

## 2021-08-24 NOTE — Telephone Encounter (Signed)
-----   Message from [name], MD sent at 08/22/2021  9:36 AM EDT ----- Shows constipation - enqyire if she is taking anything for this and what dose

## 2021-08-24 NOTE — Telephone Encounter (Signed)
Spoke to patient yesterday and asked her if she was currently taking something for her constipation and she stated that the only thing that she currently takes is Miralax BID since Linzess was too much for her. Patient stated that she was given Linzess 290 mcg and it would cause her to have diarrhea. She was then told to decrease it to 145 mcg and it would still cause her to have diarrhea. Finally she was given samples for Linzess 72 mcg every other day and it cause her to have loose stools. So, at the end she was recommended to take Miralax twice a day and that's what she has been doing. Patient stated that she has a bowel movement either daily or every other day but that she doesn't eat a lot. Therefore, she stated that not much of nothing comes out.

## 2021-08-24 NOTE — Telephone Encounter (Signed)
Patient was contacted to let her know that Dr. Vicente Males is recommending for her to start taking Lactulose 15 cc twice a day and to stop Miralax. Patient understood and had no further questions.

## 2021-09-02 ENCOUNTER — Telehealth: Payer: Self-pay

## 2021-09-06 NOTE — Telephone Encounter (Signed)
Called patient earlier today and right now but had to leave her a voicemail. Once she calls back, I will ask her the questions Dr. Tobi Bastos wants me to ask her.

## 2021-09-08 NOTE — Telephone Encounter (Signed)
Patient called back to let me know that she went to the ED and was seen due to her abdominal pain. They stated that everything came out to be normal. However, patient stated that she would give [place] a call back if she needed from [place] again.

## 2021-09-09 ENCOUNTER — Emergency Department
Admission: EM | Admit: 2021-09-09 | Discharge: 2021-09-09 | Disposition: A | Discharge status: Home / Self Care | Payer: Medicare HMO | Attending: Emergency Medicine | Admitting: Emergency Medicine

## 2021-09-09 ENCOUNTER — Emergency Department: Payer: Medicare HMO

## 2021-09-09 ENCOUNTER — Other Ambulatory Visit: Payer: Self-pay

## 2021-09-09 ENCOUNTER — Encounter: Payer: Self-pay | Admitting: Intensive Care

## 2021-09-09 DIAGNOSIS — K219 Gastro-esophageal reflux disease without esophagitis: Secondary | ICD-10-CM | POA: Diagnosis not present

## 2021-09-09 DIAGNOSIS — N189 Chronic kidney disease, unspecified: Secondary | ICD-10-CM | POA: Insufficient documentation

## 2021-09-09 DIAGNOSIS — I129 Hypertensive chronic kidney disease with stage 1 through stage 4 chronic kidney disease, or unspecified chronic kidney disease: Secondary | ICD-10-CM | POA: Insufficient documentation

## 2021-09-09 DIAGNOSIS — R079 Chest pain, unspecified: Secondary | ICD-10-CM

## 2021-09-09 DIAGNOSIS — R0789 Other chest pain: Secondary | ICD-10-CM | POA: Diagnosis present

## 2021-09-09 LAB — CBC
HCT: 48.6 % — ABNORMAL HIGH (ref 36.0–46.0)
Hemoglobin: 15.3 g/dL — ABNORMAL HIGH (ref 12.0–15.0)
MCH: 27.4 pg (ref 26.0–34.0)
MCHC: 31.5 g/dL (ref 30.0–36.0)
MCV: 86.9 fL (ref 80.0–100.0)
Platelets: 242 10*3/uL (ref 150–400)
RBC: 5.59 MIL/uL — ABNORMAL HIGH (ref 3.87–5.11)
RDW: 13.4 % (ref 11.5–15.5)
WBC: 3.9 10*3/uL — ABNORMAL LOW (ref 4.0–10.5)
nRBC: 0 % (ref 0.0–0.2)

## 2021-09-09 LAB — BASIC METABOLIC PANEL
Anion gap: 12 (ref 5–15)
BUN: 15 mg/dL (ref 8–23)
CO2: 23 mmol/L (ref 22–32)
Calcium: 10 mg/dL (ref 8.9–10.3)
Chloride: 103 mmol/L (ref 98–111)
Creatinine, Ser: 1.17 mg/dL — ABNORMAL HIGH (ref 0.44–1.00)
GFR, Estimated: 49 mL/min — ABNORMAL LOW (ref >60)
Glucose, Bld: 88 mg/dL (ref 70–99)
Potassium: 3.7 mmol/L (ref 3.5–5.1)
Sodium: 138 mmol/L (ref 135–145)

## 2021-09-09 LAB — URINALYSIS, ROUTINE W REFLEX MICROSCOPIC
Bilirubin Urine: NEGATIVE
Glucose, UA: NEGATIVE mg/dL
Ketones, ur: 5 mg/dL — AB
Leukocytes,Ua: NEGATIVE
Nitrite: NEGATIVE
Protein, ur: NEGATIVE mg/dL
Specific Gravity, Urine: 1.019 (ref 1.005–1.030)
pH: 5 (ref 5.0–8.0)

## 2021-09-09 LAB — TROPONIN I (HIGH SENSITIVITY)
Troponin I (High Sensitivity): 10 ng/L (ref <18)
Troponin I (High Sensitivity): 11 ng/L (ref <18)

## 2021-09-09 MED ORDER — FAMOTIDINE 20 MG PO TABS: 20.0000 mg | ORAL_TABLET | Freq: Two times a day (BID) | ORAL | 0 refills | Status: DC

## 2021-09-09 NOTE — ED Provider Notes (Signed)
[facility] Provider Note    Event Date/Time   First MD Initiated Contact with Patient 09/09/21 [phone]     (approximate)   History   Chief Complaint: Chest Pain   HPI  Michelle Kaufman is a 76 y.o. female with a history of hypertension, CKD who comes ED complaining of central chest tightness for the past several months.  It is intermittent, no aggravating  factors.  Not exertional, not pleuritic.  Seems to happen more often when she skips dinner.  Alleviated by Tums.  No constipation or diarrhea.  No fever.  No vomiting.     Physical Exam   Triage Vital Signs: ED Triage Vitals  Enc Vitals Group     BP 09/09/21 0706 126/79     Pulse Rate 09/09/21 0706 65     Resp 09/09/21 0706 14     Temp 09/09/21 0706 98.3 F (36.8 C)     Temp Source 09/09/21 0706 Oral     SpO2 09/09/21 0706 100 %     Weight 09/09/21 0704 105 lb (47.6 kg)     Height 09/09/21 0704 '5\' 2"'$  (1.575 m)     Head Circumference --      Peak Flow --      Pain Score 09/09/21 0704 9     Pain Loc --      Pain Edu? --      Excl. in [place]? --     Most recent vital signs: Vitals:   09/09/21 0830 09/09/21 1148  BP: 114/74 122/76  Pulse: 61 69  Resp: 19 18  Temp:    SpO2: 95% 97%    General: Awake, no distress.  CV:  Good peripheral perfusion.  Regular rate rhythm.  Normal peripheral pulses. Resp:  Normal effort.  Clear to auscultation bilaterally Abd:  No distention.  Soft with mild left upper quadrant tenderness Other:  No lower extremity edema.  Moist oral mucosa.   ED Results / Procedures / Treatments   Labs (all labs ordered are listed, but only abnormal results are displayed) Labs Reviewed  BASIC METABOLIC PANEL - Abnormal; Notable for the following components:      Result Value   Creatinine, Ser 1.17 (*)    GFR, Estimated 49 (*)    All other components within normal limits  CBC - Abnormal; Notable for the following components:   WBC 3.9 (*)    RBC 5.59 (*)    Hemoglobin  15.3 (*)    HCT 48.6 (*)    All other components within normal limits  URINALYSIS, ROUTINE W REFLEX MICROSCOPIC - Abnormal; Notable for the following components:   Color, Urine YELLOW (*)    APPearance HAZY (*)    Hgb urine dipstick MODERATE (*)    Ketones, ur 5 (*)    Bacteria, UA RARE (*)    All other components within normal limits  TROPONIN I (HIGH SENSITIVITY)  TROPONIN I (HIGH SENSITIVITY)     EKG Interpreted by me Sinus rhythm rate of 61.  Normal axis, normal intervals.  Normal QRS and ST segments.  T wave inversions in V3 and V4, which are unchanged compared to previous EKGs in January 2023.  2 PVCs on the strip.   RADIOLOGY Chest x-ray interpreted by me, appears normal.  Radiology report reviewed   PROCEDURES:  Procedures   MEDICATIONS ORDERED IN ED: Medications - No data to display   IMPRESSION / MDM / [place] / ED COURSE  I  reviewed the triage vital signs and the nursing notes.                              Differential diagnosis includes, but is not limited to, GERD, non-STEMI, pneumonia, pleural effusion, pneumothorax  Patient's presentation is most consistent with acute presentation with potential threat to life or bodily function.  Patient presents with atypical chest pain, symptoms suspicious for GERD.  Due to her age and comorbidities, chest x-ray EKG labs including serial troponins were obtained.  This was all reassuring.  She is feeling better after receiving an acids in the ED.  We will keep her on a course of Pepcid, patient is not requiring admission today, recommend follow-up with primary care.       FINAL CLINICAL IMPRESSION(S) / ED DIAGNOSES   Final diagnoses:  Nonspecific chest pain  Gastroesophageal reflux disease without esophagitis     Rx / DC Orders   ED Discharge Orders          Ordered    famotidine (PEPCID) 20 MG tablet  2 times daily        09/09/21 1135             Note:  This document was prepared  using Dragon voice recognition software and may include unintentional dictation errors.   Carrie Mew, MD 09/15/21 [phone]

## 2021-09-09 NOTE — Discharge Instructions (Signed)
Your EKG, chest x-ray, and lab tests were all normal in the emergency department today.  Try taking Pepcid 2 times a day for the next week to see if this alleviates your symptoms.  Follow-up with your primary care doctor next week for further evaluation.

## 2021-09-09 NOTE — ED Triage Notes (Signed)
Patient c/o central chest tightness for weeks. Also c/o pelvic pain.

## 2021-09-20 ENCOUNTER — Other Ambulatory Visit: Payer: Self-pay

## 2021-09-20 ENCOUNTER — Ambulatory Visit (INDEPENDENT_AMBULATORY_CARE_PROVIDER_SITE_OTHER): Payer: Medicare HMO | Admitting: Gastroenterology

## 2021-09-20 ENCOUNTER — Encounter: Payer: Self-pay | Admitting: Gastroenterology

## 2021-09-20 VITALS — BP 121/77 | HR 66 | Temp 97.6°F | Wt 99.4 lb

## 2021-09-20 DIAGNOSIS — R1084 Generalized abdominal pain: Secondary | ICD-10-CM | POA: Diagnosis not present

## 2021-09-20 DIAGNOSIS — K5900 Constipation, unspecified: Secondary | ICD-10-CM | POA: Diagnosis not present

## 2021-09-20 NOTE — Progress Notes (Signed)
[name] MD, MRCP(U.K) [address]  [place]  [place], [place] [address]  Main: [phone]  Fax: [phone]   Primary Care Physician: Marguerita Merles, MD  Primary Gastroenterologist:  Dr. [name]   Chief Complaint  Patient presents with   Abdominal Pain   Constipation    HPI: Michelle Kaufman is a 76 y.o. female  Summary of history :   Initially referred and seen in February 2022  and was seen for bloating likely secondary to constipation . Initially some concern for weight loss but had been stable .   05/20/2020: Colonoscopy: 2 polyps 7 to 10 mm resected in the ascending colon and 2 other polyps 4 to 6 mm also resected in the same area.  A 15 mm polyp was also resected in the ascending colon an upper endoscopy was performed on the same day showed no abnormality except for gastric fluid.  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  At her last visit did not have any samples of Trulance or Amitiza to try.  Advised to try Linzess 145 mcg and take an additional 72 mcg tablet which would be lower than the 290 mcg dose.  She called [place] and said she was having diarrhea on Linzess 72 mcg we advised her to stop it and take MiraLAX 1 cap daily and if needed increase to 2 caps daily she was given Bentyl for abdominal cramping.  Subsequently she called [place] with abdominal pain and she had a CT scan of the abdomen on 08/15/2021 that showed moderate volume of colonic stool associated with constipation no other abnormalities subcentimeter lesion seen in the kidney liver and spleen too small to characterize.  She called [place] subsequently with issues of constipation we advised her to stop taking MiraLAX and start taking lactulose twice a day.  She subsequently went into the  emergency room on 09/06/2021 with abdominal pain at [facility]   At her ER visit she was complaining of abdominal pain with associated diarrhea.  Underwent a CT scan which showed no abnormalities is discharged to follow-up with GI.  Went into the emergency room on 09/09/2021 with chest pain discharged home with a diagnosis of GERD.  09/09/2021 hemoglobin 15.3 g creatinine 1.17 urinalysis trace of blood  She continues to have nonspecific abdominal discomfort.  She states the lactulose did not work when she took it twice a day.  She is taking 15 cc twice a day.  Famotidine caused her to have an upset stomach since she stopped taking famotidine as well.  She is also not taking her dicyclomine at this point of time.  She is convinced something is not right with her.  She has had multiple CT scans and colonoscopy all within the past 1 year.  She has not had a recent upper endoscopy and she requests that to be done  Current Outpatient Medications  Medication Sig Dispense Refill   Calcium Carbonate-Vit D-Min (CALCIUM 600+D PLUS MINERALS) 600-400 MG-UNIT TABS Take 1 tablet by mouth 2 (two) times daily.     citalopram (CELEXA) 10 MG tablet Take 10 mg by mouth daily.     dicyclomine (BENTYL) 10 MG capsule Take 1 capsule (10 mg total) by mouth 4 (four) times daily -  before meals and at bedtime. 30 capsule 1   ergocalciferol (VITAMIN D2) 1.25 MG (50000 UT) capsule Take by mouth.     SF 5000 PLUS 1.1 % CREA dental cream Take by mouth.     Sod Fluoride-Potassium Nitrate (PREVIDENT 5000 ENAMEL PROTECT) 1.1-5 % GEL After you floss, brush for two full minutes at night ONLY. Spit excess toothpaste out.     doxycycline (VIBRAMYCIN) 100 MG capsule Take 100 mg by mouth 2 (two) times daily. (Patient not taking: Reported on 09/20/2021)     famotidine (PEPCID) 20 MG tablet Take 1 tablet (20 mg total) by mouth 2 (two) times daily. (Patient not taking: Reported on 09/20/2021) 60 tablet 0   No current facility-administered  medications for this visit.    Allergies as of 09/20/2021 - Review Complete 09/20/2021  Allergen Reaction Noted   Alendronate  07/04/2016    ROS:  General: Negative for anorexia, weight loss, fever, chills, fatigue, weakness. ENT: Negative for hoarseness, difficulty swallowing , nasal congestion. CV: Negative for chest pain, angina, palpitations, dyspnea on exertion, peripheral edema.  Respiratory: Negative for dyspnea at rest, dyspnea on exertion, cough, sputum, wheezing.  GI: See history of present illness. GU:  Negative for dysuria, hematuria, urinary incontinence, urinary frequency, nocturnal urination.  Endo: Negative for unusual weight change.    Physical Examination:   BP 121/77   Pulse 66   Temp 97.6 F (36.4 C) (Oral)   Wt 99 lb 6.4 oz (45.1 kg)   BMI 18.18 kg/m   General: Well-nourished, well-developed in no acute distress.  Eyes: No icterus. Conjunctivae pink. Mouth: Oropharyngeal mucosa moist and pink , no lesions erythema or exudate. Lungs: Clear to auscultation bilaterally. Non-labored. Heart: Regular rate and rhythm, no murmurs rubs or gallops.  Abdomen: Bowel sounds are normal, nontender, nondistended, no hepatosplenomegaly or masses, no abdominal bruits or hernia , no rebound or guarding.   Extremities: No lower extremity edema. No clubbing or deformities. Neuro: Alert and oriented x 3.  Grossly intact. Skin: Warm and dry, no jaundice.   Psych: Alert and cooperative, normal mood and affect.   Imaging Studies: DG Chest 2 View  Result Date: 09/09/2021 CLINICAL DATA:  76 year old female with history of chest tightness for several weeks. Chest pain. EXAM: CHEST - 2 VIEW COMPARISON:  Chest x-ray 01/30/2020. FINDINGS: Lung volumes are normal. No consolidative airspace disease. No pleural effusions. No pneumothorax. No pulmonary nodule or mass noted. Pulmonary vasculature and the cardiomediastinal silhouette are within normal limits. Atherosclerosis in the  thoracic aorta. IMPRESSION: 1. No radiographic evidence of acute cardiopulmonary disease. 2. Aortic atherosclerosis. Electronically Signed   By: Vinnie Langton M.D.   On: 09/09/2021 07:35    Assessment and Plan:   Michelle Kaufman is a 76 y.o. y/o female here to follow-up for abdominal discomfort she has had multiple CT scans recently and only finding has been constipation.  I have tried her on Linzess various doses, MiraLAX, lactulose which have either caused her to not respond or caused her to have diarrhea and hence she stopped tried dicyclomine which she stopped taking, trial of famotidine caused her to have an upset stomach.  Plan 1.  Commence on lactulose 15 cc twice a day I have advised her that if it does not work to increase it up to 4 times a day, if it is too much to cut down on the dose.  She needs to find the ideal dose that works for her.  2.  EGD  3.  If famotidine does not help with her heartburn to try Tums as needed.  Her symptoms are not often and hence would not recommend a PPI at this point of time.   I have discussed alternative options, risks & benefits,  which include, but are not limited to, bleeding, infection, perforation,respiratory complication & drug reaction.  The patient agrees with this plan & written consent will be obtained.      Dr [name]  MD,MRCP [facility]) Follow up in as needed

## 2021-09-24 ENCOUNTER — Other Ambulatory Visit: Payer: Self-pay

## 2021-09-24 ENCOUNTER — Encounter (HOSPITAL_COMMUNITY): Payer: Self-pay

## 2021-09-24 ENCOUNTER — Emergency Department (HOSPITAL_COMMUNITY)
Admission: EM | Admit: 2021-09-24 | Discharge: 2021-09-24 | Disposition: A | Discharge status: Home / Self Care | Payer: Medicare HMO | Attending: Emergency Medicine | Admitting: Emergency Medicine

## 2021-09-24 DIAGNOSIS — E871 Hypo-osmolality and hyponatremia: Secondary | ICD-10-CM | POA: Diagnosis not present

## 2021-09-24 DIAGNOSIS — R1032 Left lower quadrant pain: Secondary | ICD-10-CM | POA: Insufficient documentation

## 2021-09-24 DIAGNOSIS — R109 Unspecified abdominal pain: Secondary | ICD-10-CM | POA: Diagnosis present

## 2021-09-24 DIAGNOSIS — G8929 Other chronic pain: Secondary | ICD-10-CM | POA: Diagnosis not present

## 2021-09-24 DIAGNOSIS — R1031 Right lower quadrant pain: Secondary | ICD-10-CM | POA: Insufficient documentation

## 2021-09-24 LAB — CBC
HCT: 46.3 % — ABNORMAL HIGH (ref 36.0–46.0)
Hemoglobin: 14.7 g/dL (ref 12.0–15.0)
MCH: 28 pg (ref 26.0–34.0)
MCHC: 31.7 g/dL (ref 30.0–36.0)
MCV: 88.2 fL (ref 80.0–100.0)
Platelets: 235 10*3/uL (ref 150–400)
RBC: 5.25 MIL/uL — ABNORMAL HIGH (ref 3.87–5.11)
RDW: 13.8 % (ref 11.5–15.5)
WBC: 5.2 10*3/uL (ref 4.0–10.5)
nRBC: 0 % (ref 0.0–0.2)

## 2021-09-24 LAB — COMPREHENSIVE METABOLIC PANEL
ALT: 9 U/L (ref 0–44)
AST: 23 U/L (ref 15–41)
Albumin: 4.3 g/dL (ref 3.5–5.0)
Alkaline Phosphatase: 55 U/L (ref 38–126)
Anion gap: 12 (ref 5–15)
BUN: 11 mg/dL (ref 8–23)
CO2: 22 mmol/L (ref 22–32)
Calcium: 9.6 mg/dL (ref 8.9–10.3)
Chloride: 100 mmol/L (ref 98–111)
Creatinine, Ser: 1.09 mg/dL — ABNORMAL HIGH (ref 0.44–1.00)
GFR, Estimated: 53 mL/min — ABNORMAL LOW (ref >60)
Glucose, Bld: 89 mg/dL (ref 70–99)
Potassium: 4.8 mmol/L (ref 3.5–5.1)
Sodium: 134 mmol/L — ABNORMAL LOW (ref 135–145)
Total Bilirubin: 1 mg/dL (ref 0.3–1.2)
Total Protein: 7.1 g/dL (ref 6.5–8.1)

## 2021-09-24 LAB — URINALYSIS, ROUTINE W REFLEX MICROSCOPIC
Bilirubin Urine: NEGATIVE
Glucose, UA: NEGATIVE mg/dL
Hgb urine dipstick: NEGATIVE
Ketones, ur: NEGATIVE mg/dL
Leukocytes,Ua: NEGATIVE
Nitrite: NEGATIVE
Protein, ur: NEGATIVE mg/dL
Specific Gravity, Urine: 1.004 — ABNORMAL LOW (ref 1.005–1.030)
pH: 6 (ref 5.0–8.0)

## 2021-09-24 LAB — LIPASE, BLOOD: Lipase: 46 U/L (ref 11–51)

## 2021-09-24 NOTE — ED Triage Notes (Signed)
Pt arrived POV from home c/o abdominal pain x several months. Pt states she has been to the GI doctor and multiple ERs and they tell her nothing is wrong but something has to be.

## 2021-09-24 NOTE — Discharge Instructions (Signed)
Please read and follow all provided instructions.  Your diagnoses today include:  1. Chronic abdominal pain     Tests performed today include: Blood cell counts and platelets: Normal red and white blood cells Kidney and liver function tests: Normal kidney and liver testing today Pancreas function test (called lipase): Was normal Urine test to look for infection: No sign of urine infection Vital signs. See below for your results today.   Medications prescribed:  None  Take any prescribed medications only as directed.  Home care instructions:  Follow any educational materials contained in this packet. Continue to take lactulose and Tums as well as the dicyclomine as needed at home.  Follow-up instructions: Please follow-up with your gastroenterologist as planned for EGD and for further evaluation of your symptoms.    Return instructions:  SEEK IMMEDIATE MEDICAL ATTENTION IF: The pain does not go away or becomes severe  A temperature above 101F develops  Repeated vomiting occurs (multiple episodes)  The pain becomes localized to portions of the abdomen. The right side could possibly be appendicitis. In an adult, the left lower portion of the abdomen could be colitis or diverticulitis.  Blood is being passed in stools or vomit (bright red or black tarry stools)  You develop chest pain, difficulty breathing, dizziness or fainting, or become confused, poorly responsive, or inconsolable (young children) If you have any other emergent concerns regarding your health  Additional Information: Abdominal (belly) pain can be caused by many things. Your caregiver performed an examination and possibly ordered blood/urine tests and imaging (CT scan, x-rays, ultrasound). Many cases can be observed and treated at home after initial evaluation in the emergency department. Even though you are being discharged home, abdominal pain can be unpredictable. Therefore, you need a repeated exam if your pain  does not resolve, returns, or worsens. Most patients with abdominal pain don't have to be admitted to the hospital or have surgery, but serious problems like appendicitis and gallbladder attacks can start out as nonspecific pain. Many abdominal conditions cannot be diagnosed in one visit, so follow-up evaluations are very important.  Your vital signs today were: BP 137/73   Pulse (!) 58   Temp 97.8 F (36.6 C)   Resp 18   Ht '5\' 2"'$  (1.575 m)   Wt 44.9 kg   SpO2 100%   BMI 18.11 kg/m  If your blood pressure (bp) was elevated above 135/85 this visit, please have this repeated by your doctor within one month. --------------

## 2021-09-24 NOTE — ED Provider Notes (Signed)
[facility] Provider Note   CSN: [id number] Arrival date & time: 09/24/21  1349     History  Chief Complaint  Patient presents with   Abdominal Pain    Michelle Kaufman is a 76 y.o. female.  Patient with history of hysterectomy presents to the emergency department today for evaluation of ongoing abdominal pain.  Patient has had lower abdominal pain over the past 3 months.  She has had ED visits at [facility], [facility] both in June, [place] imaging at that time was negative.  She has followed up with gastroenterology in [place].  They are planning EGD.  Current working diagnosis is constipation.  Patient has tried different medications for this, either without relief, or unable to tolerate.  This includes MiraLAX, Linzess, Bentyl, famotidine.  She is currently taking lactulose and Tums, and Bentyl if needed.  She denies chest pain or shortness of breath.  She is eating and drinking well, no vomiting.  No dysuria, hematuria, increased frequency or urgency.  Family makes mention that they are concerned symptoms exacerbated by depression.  Patient is currently on citalopram.  The reason that they came in today was for worsening pain early this morning.  Family notes that pain seems to be worse during the overnight hours.       Home Medications Prior to Admission medications   Medication Sig Start Date End Date Taking? Authorizing Provider  Calcium Carbonate-Vit D-Min (CALCIUM 600+D PLUS MINERALS) 600-400 MG-UNIT TABS Take 1 tablet by mouth 2 (two) times daily. 01/31/21   [provider]  citalopram (CELEXA) 10 MG tablet Take 10 mg by mouth daily. 08/22/21   [provider]  dicyclomine (BENTYL) 10 MG capsule Take 1 capsule (10 mg total) by mouth 4 (four) times daily -  before meals and at bedtime. 08/09/21   [name], MD  doxycycline (VIBRAMYCIN) 100 MG capsule Take 100 mg by mouth 2 (two) times daily. Patient not taking: Reported on  09/20/2021 03/04/21   [provider]  ergocalciferol (VITAMIN D2) 1.25 MG (50000 UT) capsule Take by mouth.    [provider]  famotidine (PEPCID) 20 MG tablet Take 1 tablet (20 mg total) by mouth 2 (two) times daily. Patient not taking: Reported on 09/20/2021 09/09/21   Carrie Mew, MD  SF 5000 PLUS 1.1 % CREA dental cream Take by mouth. 06/07/21   [provider]  Sod Fluoride-Potassium Nitrate (PREVIDENT 5000 ENAMEL PROTECT) 1.1-5 % GEL After you floss, brush for two full minutes at night ONLY. Spit excess toothpaste out. 06/07/21   [provider]      Allergies    Alendronate    Review of Systems   Review of Systems  Physical Exam Updated Vital Signs BP 137/73   Pulse (!) 58   Temp 97.8 F (36.6 C)   Resp 18   Ht '5\' 2"'$  (1.575 m)   Wt 44.9 kg   SpO2 100%   BMI 18.11 kg/m  Physical Exam Vitals and nursing note reviewed.  Constitutional:      General: She is not in acute distress.    Appearance: She is well-developed.  HENT:     Head: Normocephalic and atraumatic.     Right Ear: External ear normal.     Left Ear: External ear normal.     Nose: Nose normal.  Eyes:     Conjunctiva/sclera: Conjunctivae normal.  Cardiovascular:     Rate and Rhythm: Normal rate and regular rhythm.  Heart sounds: No murmur heard. Pulmonary:     Effort: No respiratory distress.     Breath sounds: No wheezing, rhonchi or rales.  Abdominal:     Palpations: Abdomen is soft.     Tenderness: There is abdominal tenderness (Mild) in the right lower quadrant, suprapubic area and left lower quadrant. There is no guarding or rebound. Negative signs include Murphy's sign and McBurney's sign.  Musculoskeletal:     Cervical back: Normal range of motion and neck supple.     Right lower leg: No edema.     Left lower leg: No edema.  Skin:    General: Skin is warm and dry.     Findings: No rash.  Neurological:     General: No focal deficit present.      Mental Status: She is alert. Mental status is at baseline.     Motor: No weakness.  Psychiatric:        Mood and Affect: Mood normal.     ED Results / Procedures / Treatments   Labs (all labs ordered are listed, but only abnormal results are displayed) Labs Reviewed  COMPREHENSIVE METABOLIC PANEL - Abnormal; Notable for the following components:      Result Value   Sodium 134 (*)    Creatinine, Ser 1.09 (*)    GFR, Estimated 53 (*)    All other components within normal limits  CBC - Abnormal; Notable for the following components:   RBC 5.25 (*)    HCT 46.3 (*)    All other components within normal limits  URINALYSIS, ROUTINE W REFLEX MICROSCOPIC - Abnormal; Notable for the following components:   Color, Urine STRAW (*)    Specific Gravity, Urine 1.004 (*)    All other components within normal limits  LIPASE, BLOOD    EKG None  Radiology No results found.  Procedures Procedures    Medications Ordered in ED Medications - No data to display  ED Course/ Medical Decision Making/ A&P    Patient seen and examined. History obtained directly from patient. Extensive review of recent external ED and gastroenterology notes.   Labs/EKG: Independently reviewed and interpreted.  This included: CBC with normal white blood cell count, normal hemoglobin; CMP with minimally elevated creatinine at 1.09, mild hyponatremia at 134 otherwise unremarkable; lipase normal; UA without signs of infection.  Imaging: Considered CT imaging however no compelling indications to repeat this tonight.  Medications/Fluids: None ordered  Most recent vital signs reviewed and are as follows: BP 137/73   Pulse (!) 58   Temp 98.6 F (37 C) (Oral)   Resp 18   Ht '5\' 2"'$  (1.575 m)   Wt 44.9 kg   SpO2 100%   BMI 18.11 kg/m   Initial impression: Chronic abdominal pain  Discussed patient history, recent work-up, evaluation tonight with Dr. Sherry Ruffing.  No indication for further imaging here tonight,  admission, or consultation.  7:46 PM Reassessment performed. Patient appears stable.  Plan: Discharge to home.   Prescriptions written for: None  Other home care instructions discussed: Avoidance of triggers, continued treatment plan prescribed by her gastroenterologist.  ED return instructions discussed: The patient was urged to return to the Emergency Department immediately with worsening of current symptoms, worsening abdominal pain, persistent vomiting, blood noted in stools, fever, or any other concerns. The patient verbalized understanding.   Follow-up instructions discussed: Patient encouraged to follow-up with their GI and PCP as planned.  Medical Decision Making Amount and/or Complexity of Data Reviewed Labs: ordered.   For this patient's complaint of abdominal pain, the following conditions were considered on the differential diagnosis: gastritis/PUD, enteritis/duodenitis, appendicitis, cholelithiasis/cholecystitis, cholangitis, pancreatitis, ruptured viscus, colitis, diverticulitis, small/large bowel obstruction, proctitis, cystitis, pyelonephritis, ureteral colic, aortic dissection, aortic aneurysm. In women, ovarian cysts, and tubo-ovarian abscess were also considered. Atypical chest etiologies were also considered including ACS, PE, and pneumonia.   The patient's vital signs, pertinent lab work and imaging were reviewed and interpreted as discussed in the ED course. Hospitalization was considered for further testing, treatments, or serial exams/observation. However as patient is well-appearing, has a stable exam, and reassuring studies today, I do not feel that they warrant admission at this time. This plan was discussed with the patient who verbalizes agreement and comfort with this plan and seems reliable and able to return to the Emergency Department with worsening or changing symptoms.          Final Clinical Impression(s) / ED  Diagnoses Final diagnoses:  Chronic abdominal pain    Rx / DC Orders ED Discharge Orders     None         Carlisle Cater, PA-C 09/24/21 1949    Tegeler, Gwenyth Allegra, MD 09/24/21 2237

## 2021-10-10 ENCOUNTER — Encounter: Payer: Self-pay | Admitting: Oncology

## 2021-10-10 ENCOUNTER — Inpatient Hospital Stay: Payer: Medicare HMO | Attending: Oncology | Admitting: Oncology

## 2021-10-10 ENCOUNTER — Inpatient Hospital Stay: Payer: Medicare HMO

## 2021-10-10 VITALS — BP 138/83 | HR 58 | Temp 96.6°F | Wt 99.9 lb

## 2021-10-10 DIAGNOSIS — G8929 Other chronic pain: Secondary | ICD-10-CM | POA: Diagnosis not present

## 2021-10-10 DIAGNOSIS — R109 Unspecified abdominal pain: Secondary | ICD-10-CM | POA: Insufficient documentation

## 2021-10-10 DIAGNOSIS — Z720 Tobacco use: Secondary | ICD-10-CM | POA: Insufficient documentation

## 2021-10-10 DIAGNOSIS — F17200 Nicotine dependence, unspecified, uncomplicated: Secondary | ICD-10-CM

## 2021-10-10 DIAGNOSIS — R634 Abnormal weight loss: Secondary | ICD-10-CM | POA: Diagnosis not present

## 2021-10-10 DIAGNOSIS — K5909 Other constipation: Secondary | ICD-10-CM | POA: Insufficient documentation

## 2021-10-10 LAB — T4, FREE: Free T4: 0.93 ng/dL (ref 0.61–1.12)

## 2021-10-10 LAB — TSH: TSH: 0.971 u[IU]/mL (ref 0.350–4.500)

## 2021-10-10 LAB — LACTATE DEHYDROGENASE: LDH: 122 U/L (ref 98–192)

## 2021-10-10 NOTE — Assessment & Plan Note (Addendum)
Likely secondary to decreased oral intake. Patient's primary care provider has recommend patient to take nutritional supplements which she has not been doing.  She is motivated and she will try. Check TSH, free T4 Patient is someday smoker, 60-year smoking history.  Check CT chest without contrast Recent blood work showed no leukocytosis, abnormal differential.  I will hold off repeating. If work-up is negative, I do not think she needs to follow-up actively with hematology oncology.

## 2021-10-10 NOTE — Assessment & Plan Note (Signed)
Smoke cessation was discussed with patient. 

## 2021-10-10 NOTE — Progress Notes (Signed)
Hematology/Oncology Consult note [phone] [phone]         Patient Care Team: Marguerita Merles, MD as PCP - General (Family Medicine)  REFERRING PROVIDER: Marguerita Merles, MD   CHIEF COMPLAINTS/REASON FOR VISIT:  Evaluation of unintentional weight loss  HISTORY OF PRESENTING ILLNESS:   Michelle Kaufman is a  76 y.o.  female with PMH listed below was seen in consultation at the request of  Marguerita Merles, MD  for evaluation of unintentional weight loss  Patient reports that she used to weigh 110 pounds.  Currently she weighs 99 pounds.  She does not have good appetite and does not eat much.,  Patient is convinced that something is not right with her.  Patient has had CT abdomen pelvis scan done which did not explain her chronic abdominal pain.  She has chronic constipation.  Chronic abdominal pain and she has followed up with gastroenterology.Last seen by Dr. Vicente Males on 09/20/2021.  Patient is recommended to have a repeat EGD for further evaluation.  She was also recommended to take lactulose for chronic constipation.  08/15/2021, CT abdomen pelvis with contrast showed no acute abnormality in abdomen/pelvis.  Moderate colonic stool.  Subcentimeter low-density lesions in the kidneys, liver and spleen, too small to be accurately characterize.  Likely cyst.  No need for follow-up.  MEDICAL HISTORY:  Past Medical History:  Diagnosis Date   Chronic kidney disease    CKD, Stage 3   Hypercalcemia    Hypertension    Osteoporosis     SURGICAL HISTORY: Past Surgical History:  Procedure Laterality Date   COLONOSCOPY WITH PROPOFOL N/A 10/02/2014   Procedure: COLONOSCOPY WITH PROPOFOL;  Surgeon: Josefine Class, MD;  Location[facility] ENDOSCOPY;  Service: Endoscopy;  Laterality: N/A;   COLONOSCOPY WITH PROPOFOL N/A 05/20/2020   Procedure: COLONOSCOPY WITH PROPOFOL;  Surgeon: [name], MD;  Location: [facility] ENDOSCOPY;  Service: Gastroenterology;  Laterality: N/A;    ESOPHAGOGASTRODUODENOSCOPY (EGD) WITH PROPOFOL N/A 05/20/2020   Procedure: ESOPHAGOGASTRODUODENOSCOPY (EGD) WITH PROPOFOL;  Surgeon: [name], MD;  Location: [facility] ENDOSCOPY;  Service: Gastroenterology;  Laterality: N/A;   JOINT REPLACEMENT Right 2003    SOCIAL HISTORY: Social History   Socioeconomic History   Marital status: Married    Spouse name: Not on file   Number of children: Not on file   Years of education: Not on file   Highest education level: Not on file  Occupational History   Not on file  Tobacco Use   Smoking status: Some Days    Packs/day: 0.50    Years: 60.00    Total pack years: 30.00    Types: Cigarettes   Smokeless tobacco: Never  Vaping Use   Vaping Use: Never used  Substance and Sexual Activity   Alcohol use: Never   Drug use: Never   Sexual activity: Not on file  Other Topics Concern   Not on file  Social History Narrative   Not on file   Social Determinants of Health   Financial Resource Strain: Not on file  Food Insecurity: Not on file  Transportation Needs: Not on file  Physical Activity: Not on file  Stress: Not on file  Social Connections: Not on file  Intimate Partner Violence: Not on file    FAMILY HISTORY: Family History  Problem Relation Age of Onset   Breast cancer Neg Hx     ALLERGIES:  is allergic to alendronate.  MEDICATIONS:  Current Outpatient Medications  Medication Sig Dispense Refill  Calcium Carbonate-Vit D-Min (CALCIUM 600+D PLUS MINERALS) 600-400 MG-UNIT TABS Take 1 tablet by mouth 2 (two) times daily.     citalopram (CELEXA) 10 MG tablet Take 10 mg by mouth daily.     dicyclomine (BENTYL) 10 MG capsule Take 1 capsule (10 mg total) by mouth 4 (four) times daily -  before meals and at bedtime. 30 capsule 1   ergocalciferol (VITAMIN D2) 1.25 MG (50000 UT) capsule Take by mouth.     Sod Fluoride-Potassium Nitrate (PREVIDENT 5000 ENAMEL PROTECT) 1.1-5 % GEL After you floss, brush for two full minutes at night ONLY.  Spit excess toothpaste out.     doxycycline (VIBRAMYCIN) 100 MG capsule Take 100 mg by mouth 2 (two) times daily. (Patient not taking: Reported on 09/20/2021)     famotidine (PEPCID) 20 MG tablet Take 1 tablet (20 mg total) by mouth 2 (two) times daily. (Patient not taking: Reported on 09/20/2021) 60 tablet 0   SF 5000 PLUS 1.1 % CREA dental cream Take by mouth. (Patient not taking: Reported on 10/10/2021)     No current facility-administered medications for this visit.    Review of Systems  Constitutional:  Positive for unexpected weight change. Negative for appetite change, chills, fatigue and fever.  HENT:   Negative for hearing loss and voice change.   Eyes:  Negative for eye problems.  Respiratory:  Negative for chest tightness and cough.   Cardiovascular:  Negative for chest pain.  Gastrointestinal:  Negative for abdominal distention, abdominal pain and blood in stool.  Endocrine: Negative for hot flashes.  Genitourinary:  Negative for difficulty urinating and frequency.   Musculoskeletal:  Negative for arthralgias.  Skin:  Negative for itching and rash.  Neurological:  Negative for extremity weakness.  Hematological:  Negative for adenopathy.  Psychiatric/Behavioral:  Negative for confusion.    PHYSICAL EXAMINATION: ECOG PERFORMANCE STATUS: 1 - Symptomatic but completely ambulatory Vitals:   10/10/21 0914  BP: 138/83  Pulse: (!) 58  Temp: (!) 96.6 F (35.9 C)   Filed Weights   10/10/21 0914  Weight: 99 lb 14.4 oz (45.3 kg)    Physical Exam Constitutional:      General: She is not in acute distress.    Comments: Thin built  HENT:     Head: Normocephalic and atraumatic.  Eyes:     General: No scleral icterus. Cardiovascular:     Rate and Rhythm: Normal rate and regular rhythm.     Heart sounds: Normal heart sounds.  Pulmonary:     Effort: Pulmonary effort is normal. No respiratory distress.     Breath sounds: No wheezing.  Abdominal:     General: Bowel sounds are  normal. There is no distension.     Palpations: Abdomen is soft.  Musculoskeletal:        General: No deformity. Normal range of motion.     Cervical back: Normal range of motion and neck supple.  Skin:    General: Skin is warm and dry.     Findings: No erythema or rash.  Neurological:     Mental Status: She is alert and oriented to person, place, and time. Mental status is at baseline.     Cranial Nerves: No cranial nerve deficit.     Coordination: Coordination normal.  Psychiatric:        Mood and Affect: Mood normal.     LABORATORY DATA:  I have reviewed the data as listed    Latest Ref Rng & Units 09/24/2021  2:55 PM 09/09/2021    7:15 AM 04/11/2020    9:13 PM  CBC  WBC 4.0 - 10.5 K/uL 5.2  3.9  6.6   Hemoglobin 12.0 - 15.0 g/dL 14.7  15.3  14.4   Hematocrit 36.0 - 46.0 % 46.3  48.6  44.3   Platelets 150 - 400 K/uL 235  242  216       Latest Ref Rng & Units 09/24/2021    2:55 PM 09/09/2021    7:15 AM 08/15/2021    8:08 AM  CMP  Glucose 70 - 99 mg/dL 89  88    BUN 8 - 23 mg/dL 11  15    Creatinine 0.44 - 1.00 mg/dL 1.09  1.17  1.30   Sodium 135 - 145 mmol/L 134  138    Potassium 3.5 - 5.1 mmol/L 4.8  3.7    Chloride 98 - 111 mmol/L 100  103    CO2 22 - 32 mmol/L 22  23    Calcium 8.9 - 10.3 mg/dL 9.6  10.0    Total Protein 6.5 - 8.1 g/dL 7.1     Total Bilirubin 0.3 - 1.2 mg/dL 1.0     Alkaline Phos 38 - 126 U/L 55     AST 15 - 41 U/L 23     ALT 0 - 44 U/L 9         RADIOGRAPHIC STUDIES: I have personally reviewed the radiological images as listed and agreed with the findings in the report. DG Chest 2 View  Result Date: 09/09/2021 CLINICAL DATA:  76 year old female with history of chest tightness for several weeks. Chest pain. EXAM: CHEST - 2 VIEW COMPARISON:  Chest x-ray 01/30/2020. FINDINGS: Lung volumes are normal. No consolidative airspace disease. No pleural effusions. No pneumothorax. No pulmonary nodule or mass noted. Pulmonary vasculature and the  cardiomediastinal silhouette are within normal limits. Atherosclerosis in the thoracic aorta. IMPRESSION: 1. No radiographic evidence of acute cardiopulmonary disease. 2. Aortic atherosclerosis. Electronically Signed   By: Vinnie Langton M.D.   On: 09/09/2021 07:35   CT ABDOMEN PELVIS W CONTRAST  Result Date: 08/15/2021 CLINICAL DATA:  Acute nonlocalized abdominal pain.  Bloating. EXAM: CT ABDOMEN AND PELVIS WITH CONTRAST TECHNIQUE: Multidetector CT imaging of the abdomen and pelvis was performed using the standard protocol following bolus administration of intravenous contrast. RADIATION DOSE REDUCTION: This exam was performed according to the departmental dose-optimization program which includes automated exposure control, adjustment of the mA and/or kV according to patient size and/or use of iterative reconstruction technique. CONTRAST:  66m OMNIPAQUE IOHEXOL 300 MG/ML  SOLN COMPARISON:  None Available. FINDINGS: Lower chest: Minor hypoventilatory changes in the lung bases. A 16 mm dilated vessel in the right lower lobe may represent a small venous varix or AV malformation, of doubtful clinical significance in a patient of this age. Hepatobiliary: Subcentimeter low-density lesion in the central liver adjacent to the right hepatic vein is too small to characterize, may represent small cyst or hemangioma. No suspicious liver lesion. Gallbladder partially distended, no calcified stone. No biliary dilatation. Pancreas: Unremarkable. No pancreatic ductal dilatation or surrounding inflammatory changes. No pancreatic mass. Spleen: Tiny subcentimeter hypodensity in the medial spleen is too small to characterize, typically benign. Spleen is normal in size. Adrenals/Urinary Tract: No adrenal nodule. No hydronephrosis or perinephric edema. Homogeneous renal enhancement with symmetric excretion on delayed phase imaging. Small low-density lesions within both kidneys are all subcentimeter, too small to characterize but  likely small cysts. No dedicated  follow-up is recommended. There is no suspicious lesion. Urinary bladder is essentially empty. Stomach/Bowel: Stomach is within normal limits. Appendix appears normal. No evidence of bowel wall thickening, distention, or inflammatory changes. Moderate volume of colonic stool. Vascular/Lymphatic: Normal caliber abdominal aorta, mild atherosclerosis and tortuosity. Patent portal, splenic, and mesenteric veins. No suspicious abdominopelvic adenopathy. Reproductive: The uterus is not well seen, may be absent or atrophic. No adnexal mass. Other: No ascites. No abdominopelvic collection. No omental thickening. No abdominal wall hernia. Musculoskeletal: Right hip arthroplasty. Scoliosis and mild lumbar spondylosis. There are no acute or suspicious osseous abnormalities. IMPRESSION: 1. No acute abnormality in the abdomen/pelvis. 2. Moderate volume of colonic stool, can be seen with constipation. No other explanation for abdominal pain. 3. Subcentimeter low-density lesions in the kidneys, liver, and spleen, too small to accurately characterize, but likely cysts. These are likely benign, needing no further follow-up. Aortic Atherosclerosis (ICD10-I70.0). Electronically Signed   By: Keith Rake M.D.   On: 08/15/2021 15:12       ASSESSMENT & PLAN:   Weight loss Likely secondary to decreased oral intake. Patient's primary care provider has recommend patient to take nutritional supplements which she has not been doing.  She is motivated and she will try. Check TSH, free T4 Patient is someday smoker, 60-year smoking history.  Check CT chest without contrast Recent blood work showed no leukocytosis, abnormal differential.  I will hold off repeating. If work-up is negative, I do not think she needs to follow-up actively with hematology oncology.   Tobacco use Smoke cessation was discussed with patient.   Orders Placed This Encounter  Procedures   CT Chest Wo Contrast     Standing Status:   Future    Standing Expiration Date:   10/10/2022    Order Specific Question:   Preferred imaging location?    Answer:   [facility]   TSH    Standing Status:   Future    Number of Occurrences:   1    Standing Expiration Date:   10/11/2022   T4, free    Standing Status:   Future    Number of Occurrences:   1    Standing Expiration Date:   10/11/2022   Lactate dehydrogenase    Standing Status:   Future    Number of Occurrences:   1    Standing Expiration Date:   10/11/2022   Follow-up as needed All questions were answered. The patient knows to call the clinic with any problems, questions or concerns.  Marguerita Merles, MD    Thank you for this kind referral and the opportunity to participate in the care of this patient. A copy of today's note is routed to referring provider   Earlie Server, MD, PhD [facility] Hematology Oncology 10/10/2021

## 2021-10-17 ENCOUNTER — Other Ambulatory Visit: Payer: Self-pay | Admitting: Oncology

## 2021-10-17 ENCOUNTER — Telehealth: Payer: Self-pay

## 2021-10-17 ENCOUNTER — Ambulatory Visit: Payer: Medicare HMO

## 2021-10-17 DIAGNOSIS — R634 Abnormal weight loss: Secondary | ICD-10-CM

## 2021-10-17 DIAGNOSIS — F17200 Nicotine dependence, unspecified, uncomplicated: Secondary | ICD-10-CM

## 2021-10-17 NOTE — Telephone Encounter (Signed)
Received notification that patient's niece called questioning CT scan tomorrow, per Dr Tasia Catchings need to r/o any findings so CT is scheduled due to smoker & weight loss. Patient notified and aware of CT apt tomorrow. Niece also also no answer, left message.

## 2021-10-18 ENCOUNTER — Ambulatory Visit
Admission: RE | Admit: 2021-10-18 | Discharge: 2021-10-18 | Disposition: A | Discharge status: Home / Self Care | Payer: Medicare HMO | Source: Ambulatory Visit | Attending: Oncology | Admitting: Oncology

## 2021-10-18 DIAGNOSIS — F17200 Nicotine dependence, unspecified, uncomplicated: Secondary | ICD-10-CM | POA: Diagnosis present

## 2021-10-18 DIAGNOSIS — R634 Abnormal weight loss: Secondary | ICD-10-CM | POA: Insufficient documentation

## 2021-10-24 ENCOUNTER — Telehealth: Payer: Self-pay | Admitting: Gastroenterology

## 2021-10-24 ENCOUNTER — Telehealth: Payer: Self-pay

## 2021-10-24 DIAGNOSIS — R911 Solitary pulmonary nodule: Secondary | ICD-10-CM

## 2021-10-24 NOTE — Telephone Encounter (Signed)
-----   Message from Earlie Server, MD sent at 10/21/2021  4:18 PM EDT ----- Regarding: RE: CT SCAN Contact: [phone] CT chest wo contrast showed small lung nodules which needs to be monitored.  Recommend repeat CT chest wo in 12 months, please order that. She can follow up after that CT- MD only  CT findings do not explain her weight loss. Weight loss is likely due to decreased appetite and oral intake. Recommend her to follow up with GI and repeat EGD  ----- Message ----- From: Evelina Dun, RN Sent: 10/21/2021   9:15 AM EDT To: Evelina Dun, RN; Earlie Server, MD; # Subject: FW: CT SCAN                                    Dr. Tasia Catchings please advise.  ----- Message ----- From: Secundino Ginger Sent: 10/21/2021   8:44 AM EDT To: Evelina Dun, RN; Rhoderick Moody, CMA Subject: CT SCAN                                        THIS LADY SAW DR Tasia Catchings ON 7/13 AS A NEW PATIENT. SHE HAD A CT SCAN ON 8/8. SHE CALLED AND GOT ME AND WANTED TO KNOW HER CT RESULTS. CAN ONE OF YOU CALL HER PLEASE. [place] PHONE [phone] FIRST.    CELL- [phone]

## 2021-10-24 NOTE — Telephone Encounter (Signed)
Called patient back and she stated that she had spoken to Dr. Collie Siad nurse and that after giving her CT results, she recommended for Michelle Kaufman to see Dr. Vicente Males so he could scope her due to weight loss. Patient stated that she had lost about 6-8 pounds in a month. Therefore, I scheduled her an appointment to come and see Dr. Vicente Males on/  at 3:30 PM.

## 2021-10-24 NOTE — Telephone Encounter (Signed)
Called and informed patient of CT results and Dr. Collie Siad recommendation. Patient verbalized understanding and states she will contact Dr. Wilhemena Durie for f/u with repeat EGD.    [place], please schedule patient for CT chest wo in 12 months. MD only 2 days after scan. Please notify patient of appt.

## 2021-10-24 NOTE — Telephone Encounter (Signed)
Patient left vm wanting to speak with Dr Georgeann Oppenheim nurse. Requesting call back.

## 2021-11-01 ENCOUNTER — Ambulatory Visit (INDEPENDENT_AMBULATORY_CARE_PROVIDER_SITE_OTHER): Payer: Medicare HMO | Admitting: Gastroenterology

## 2021-11-01 ENCOUNTER — Encounter: Payer: Self-pay | Admitting: Gastroenterology

## 2021-11-01 VITALS — Wt 99.2 lb

## 2021-11-01 DIAGNOSIS — K5900 Constipation, unspecified: Secondary | ICD-10-CM | POA: Diagnosis not present

## 2021-11-01 NOTE — Progress Notes (Signed)
[name] MD, MRCP(U.K) [address]  [place]  [place], [place] [address]  Main: [phone]  Fax: [phone]   Primary Care Physician: Marguerita Merles, MD  Primary Gastroenterologist:  Dr. [name]   Chief Complaint  Patient presents with   Abdominal Pain    HPI: Michelle Kaufman is a 76 y.o. female Summary of history :   Initially referred and seen in February 2022  and was seen for bloating likely secondary to constipation .    05/20/2020: Colonoscopy: 2 polyps 7 to 10 mm resected in the ascending colon and 2 other polyps 4 to 6 mm also resected in the same area.  A 15 mm polyp was also resected in the ascending colon an upper endoscopy was performed on the same day showed no abnormality except for gastric fluid.  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  Was commenced on lactulose subsequently seen at [facility] ER in 08/30/2021 she has had multiple CT scans and colonoscopy all within the 1 year   Interval history  09/20/2021-11/01/2021  Was recommended an EGD last visit for some reason was not scheduled on 09/20/2021 she weighed 99 pounds and today weighs the same.  10/10/2021 seen by Dr. Tasia Catchings in oncology for unintentional weight loss.  TSH 0.97 normal, hemoglobin 14.7, CMP stable.  She lost her husband a few years back and a close friend recently and has been under a lot of stress.  She is here today to see me with a family member.  Denies any abdominal discomfort or symptoms. Current Outpatient Medications  Medication Sig Dispense Refill   Calcium Carbonate-Vit D-Min (CALCIUM 600+D PLUS MINERALS) 600-400 MG-UNIT TABS Take 1 tablet by mouth 2 (two) times daily.     citalopram (CELEXA) 10 MG tablet Take 10 mg by mouth daily.     dicyclomine (BENTYL) 10  MG capsule Take 1 capsule (10 mg total) by mouth 4 (four) times daily -  before meals and at bedtime. 30 capsule 1   doxycycline (VIBRAMYCIN) 100 MG capsule Take 100 mg by mouth 2 (two) times daily. (Patient not taking: Reported on 09/20/2021)     ergocalciferol (VITAMIN D2) 1.25 MG (50000 UT) capsule Take by mouth.     famotidine (PEPCID) 20 MG tablet Take 1 tablet (20 mg total) by mouth 2 (two) times daily. (Patient not taking: Reported on 09/20/2021) 60 tablet 0   SF 5000 PLUS 1.1 % CREA dental cream Take by mouth. (Patient not taking: Reported on 10/10/2021)     Sod Fluoride-Potassium Nitrate (PREVIDENT 5000 ENAMEL PROTECT) 1.1-5 % GEL After you floss, brush for two full minutes at night ONLY. Spit excess toothpaste out.     No current facility-administered medications for this visit.    Allergies as of 11/01/2021 - Review Complete 10/10/2021  Allergen Reaction Noted   Alendronate  07/04/2016    ROS:  General: Negative for anorexia, weight loss, fever, chills, fatigue, weakness. ENT: Negative for hoarseness, difficulty swallowing , nasal congestion. CV: Negative for chest pain, angina, palpitations, dyspnea on exertion, peripheral edema.  Respiratory: Negative for dyspnea at rest, dyspnea on exertion, cough, sputum, wheezing.  GI: See history of present illness. GU:  Negative for dysuria, hematuria, urinary incontinence, urinary frequency, nocturnal urination.  Endo: Negative for unusual weight change.  Physical Examination:   Wt 99 lb 3.2 oz (45 kg)   BMI 18.14 kg/m   General: Well-nourished, well-developed in no acute distress.  Eyes: No icterus. Conjunctivae pink. Mouth: Oropharyngeal mucosa moist and pink , no lesions erythema or exudate. Neuro: Alert and oriented x 3.  Grossly intact. Skin: Warm and dry, no jaundice.   Psych: Alert and cooperative, normal mood and affect.   Imaging Studies: CT CHEST WO CONTRAST  Result Date: 10/18/2021 CLINICAL DATA:  Weight loss,  smoker EXAM: CT CHEST WITHOUT CONTRAST TECHNIQUE: Multidetector CT imaging of the chest was performed following the standard protocol without IV contrast. RADIATION DOSE REDUCTION: This exam was performed according to the departmental dose-optimization program which includes automated exposure control, adjustment of the mA and/or kV according to patient size and/or use of iterative reconstruction technique. COMPARISON:  None Available. FINDINGS: Cardiovascular: Heart is upper limits of normal in size. No pericardial effusion. Normal caliber thoracic aorta with moderate calcified plaque. Moderate coronary artery calcifications of the LAD and RCA. Mediastinum/Nodes: Mildly patulous esophagus. No pathologically enlarged lymph nodes seen in the chest. Lungs/Pleura: Central airways are patent. Mild paraseptal emphysema. Branching tubular and partially calcified opacity of the peripheral right lower lobe seen on series 3, image 89, compatible with a bronchocele. Small solid pulmonary nodules, largest nodule is located in the left upper lobe and measures 5 mm on series 3, image 44. Additional reference nodule of the left lower lobe measuring 3 mm on series 3, image 86. Upper Abdomen: Simple appearing cyst of the left kidney, no further follow-up imaging is recommended. No acute abnormality. Musculoskeletal: Dextrocurvature of the thoracic spine. No acute osseous abnormality. IMPRESSION: 1. Solid pulmonary nodules, largest is located in the left upper lobe and measures 5 mm. Non-contrast chest CT can be considered in 12 months if patient is high-risk. This recommendation follows the consensus statement: Guidelines for Management of Incidental Pulmonary Nodules Detected on CT Images: From the Fleischner Society 2017; Radiology 2017; 284:228-243. 2. Branching tubular opacity of the right lower lobe with associated calcifications, compatible with a bronchocele. 3. Patulous esophagus, findings can be seen in the setting of  esophageal dysmotility. 4. Moderate coronary artery calcifications of the LAD and RCA. 5. Aortic Atherosclerosis (ICD10-I70.0) and Emphysema (ICD10-J43.9). Electronically Signed   By: Yetta Glassman M.D.   On: 10/18/2021 15:56    Assessment and Plan:   Michelle Kaufman is a 76 y.o. y/o female here to follow-up for abdominal discomfort she has had multiple CT scans recently and only finding has been constipation.  I have tried her on Linzess various doses, MiraLAX, lactulose which have either caused her to not respond or caused her to have diarrhea and hence she stopped tried dicyclomine which she stopped taking, trial of famotidine caused her to have an upset stomach.  Since her last visit her weight has been stable at 99 pounds.  She has had an EGD and colonoscopy in 2022.  Presently no GI symptoms as such.  I explained to her that I could perform an EGD but probably would be low yield but I am happy to proceed with the same.  She decided that she will think about it and let me know.  She stopped taking lactulose and started taking MiraLAX for her bowel movements which seems to be working.  Advised her to titrated to the dose that would provide satisfactory bowel movements.     Dr [name]  MD,MRCP [facility]) Follow up in as needed

## 2021-11-03 ENCOUNTER — Ambulatory Visit: Payer: Medicare HMO | Admitting: Gastroenterology

## 2021-11-08 ENCOUNTER — Telehealth: Payer: Self-pay

## 2021-11-08 NOTE — Telephone Encounter (Signed)
-----   Message from Earlie Server, MD sent at 11/08/2021  8:34 AM EDT ----- Please let patient know that her CT showed small lung nodules and I recommend her to repeat a CT chest without contrast in 12 months and follow-up with me in the clinic after CT.

## 2021-11-08 NOTE — Telephone Encounter (Signed)
Pt informed and given appt dates. Appt reminders mailed.

## 2021-12-12 ENCOUNTER — Telehealth: Payer: Self-pay

## 2021-12-12 NOTE — Telephone Encounter (Signed)
What dose of lactulose as she tried to be can give half of that dose to see if she does well

## 2021-12-12 NOTE — Telephone Encounter (Signed)
Dr. Vicente Males, Amitiza, [employer] are not covered by her insurance and she had tried Linzess and Lactulose and they cause her to have diarrhea. Please Advise.

## 2021-12-12 NOTE — Telephone Encounter (Signed)
Patient called and stated that she continues to have dyspepsia and constipation. Patient also stated that when she has a bowel movement, it is green. Patient stated that she was recommended to take Miralax daily but that she does it every other day because it causes for her to have diarrhea. Patient wanted to know if there was something else that she could try since she had also tried Linzess form low to high dose.

## 2021-12-12 NOTE — Telephone Encounter (Signed)
Amitiza?

## 2021-12-13 NOTE — Telephone Encounter (Signed)
Called patient back but had to leave her a detailed message letting her know what Dr. Vicente Males recommended for her to do. Patient was instructed to restart taking Lactulose but half of what she was prescribed prior. I let her know that she could call me back if she had further questions.

## 2021-12-14 ENCOUNTER — Ambulatory Visit
Admission: RE | Admit: 2021-12-14 | Discharge: 2021-12-14 | Disposition: A | Discharge status: Home / Self Care | Payer: Medicare HMO | Attending: Family Medicine | Admitting: Family Medicine

## 2021-12-14 ENCOUNTER — Ambulatory Visit
Admission: RE | Admit: 2021-12-14 | Discharge: 2021-12-14 | Disposition: A | Discharge status: Home / Self Care | Payer: Medicare HMO | Source: Ambulatory Visit | Attending: Family Medicine | Admitting: Family Medicine

## 2021-12-14 ENCOUNTER — Other Ambulatory Visit: Payer: Self-pay | Admitting: Family Medicine

## 2021-12-14 DIAGNOSIS — M899 Disorder of bone, unspecified: Secondary | ICD-10-CM

## 2022-01-24 ENCOUNTER — Telehealth: Payer: Self-pay

## 2022-01-24 NOTE — Telephone Encounter (Signed)
Just FYI: Patient called stating that she is currently taking colace, Miralax and once in a while Gas-X when her abdomen feels bloated and this has been helping her. Patient stated that she will reach out again if she has any issues.

## 2022-03-28 ENCOUNTER — Telehealth: Payer: Self-pay | Admitting: Gastroenterology

## 2022-03-28 NOTE — Telephone Encounter (Signed)
Patient is calling wondering if she needs to still be taking dicyclomine 10 mg. Requesting call back.

## 2022-03-29 ENCOUNTER — Telehealth: Payer: Self-pay

## 2022-03-29 NOTE — Telephone Encounter (Signed)
Pt calling wanting to know if Dr. Vicente Males wanted her to continue taking the Dicyclomine. She is almost out of the medication. I advised pt Dr. Vicente Males is out of the office until tomorrow, 03/30/22 and [name] once she hears back from him.

## 2022-03-29 NOTE — Telephone Encounter (Signed)
Please advise Dr. Vicente Males. Thank you.

## 2022-03-30 NOTE — Telephone Encounter (Signed)
Yes use as needed if helping  - avoid if not having any symptoms

## 2022-04-03 NOTE — Telephone Encounter (Signed)
Called patient back to let her know what Dr. Vicente Males recommended and patient agreed. Patient also stated that she is still taking Colace daily and Gas-X as needed when she has some abdominal bloating. Patient also stated that she was diagnosed with PAD and that she was getting lab results from her PCP-Dr. Lennox Grumbles and once she does, she would call us to let us know. Patient had no further questions.

## 2022-04-06 ENCOUNTER — Other Ambulatory Visit: Payer: Self-pay | Admitting: Family Medicine

## 2022-04-06 DIAGNOSIS — Z1231 Encounter for screening mammogram for malignant neoplasm of breast: Secondary | ICD-10-CM

## 2022-04-26 ENCOUNTER — Other Ambulatory Visit (INDEPENDENT_AMBULATORY_CARE_PROVIDER_SITE_OTHER): Payer: Self-pay | Admitting: Nurse Practitioner

## 2022-04-26 DIAGNOSIS — R9389 Abnormal findings on diagnostic imaging of other specified body structures: Secondary | ICD-10-CM

## 2022-04-28 ENCOUNTER — Encounter (INDEPENDENT_AMBULATORY_CARE_PROVIDER_SITE_OTHER): Payer: Self-pay | Admitting: Nurse Practitioner

## 2022-04-28 ENCOUNTER — Ambulatory Visit (INDEPENDENT_AMBULATORY_CARE_PROVIDER_SITE_OTHER): Payer: Medicare HMO | Admitting: Vascular Surgery

## 2022-04-28 ENCOUNTER — Ambulatory Visit (INDEPENDENT_AMBULATORY_CARE_PROVIDER_SITE_OTHER): Payer: Medicare HMO

## 2022-04-28 VITALS — BP 120/75 | HR 60 | Resp 16 | Wt 106.8 lb

## 2022-04-28 DIAGNOSIS — I10 Essential (primary) hypertension: Secondary | ICD-10-CM

## 2022-04-28 DIAGNOSIS — R9389 Abnormal findings on diagnostic imaging of other specified body structures: Secondary | ICD-10-CM

## 2022-04-28 DIAGNOSIS — I739 Peripheral vascular disease, unspecified: Secondary | ICD-10-CM | POA: Diagnosis not present

## 2022-04-28 DIAGNOSIS — Z720 Tobacco use: Secondary | ICD-10-CM | POA: Diagnosis not present

## 2022-04-28 NOTE — Assessment & Plan Note (Signed)
Does represent an atherosclerotic risk factor and weight loss would be of benefit.

## 2022-04-28 NOTE — Assessment & Plan Note (Signed)
To evaluate her perfusion, noninvasive studies were performed today demonstrating normal ABIs of 1.0 bilaterally with biphasic waveforms although her digital pressures were significantly reduced bilaterally consistent with small vessel disease.  No worrisome symptoms with this.  No role for intervention with small vessel disease.  Plan on follow-up in 1 year with noninvasive studies.  Smoking cessation would be of benefit and lifestyle modifications with increasing exercise and diet regulation.

## 2022-04-28 NOTE — Assessment & Plan Note (Signed)
blood pressure control important in reducing the progression of atherosclerotic disease. On appropriate oral medications.  

## 2022-04-28 NOTE — Progress Notes (Signed)
Patient ID: Michelle Kaufman, female   DOB: 1946/02/13, 77 y.o.   MRN: [id number]  Chief Complaint  Patient presents with   New Patient (Initial Visit)    Ref Lennox Grumbles consult abnormal home PAD screen    HPI Michelle Kaufman is a 77 y.o. female.  I am asked to see the patient by Dr. Lennox Grumbles for evaluation of an abnormal home health screening PAD study.  The patient does report that her feet are cold most of the time.  She denies any ulceration or infection.  She does not have typical rest pain symptoms.  She also denies lifestyle limiting claudication.  She does have multiple atherosclerotic risk factors including ongoing tobacco use.  To evaluate her perfusion, noninvasive studies were performed today demonstrating normal ABIs of 1.0 bilaterally with biphasic waveforms although her digital pressures were significantly reduced bilaterally consistent with small vessel disease.     Past Medical History:  Diagnosis Date   Chronic kidney disease    CKD, Stage 3   Hypercalcemia    Hypertension    Osteoporosis     Past Surgical History:  Procedure Laterality Date   COLONOSCOPY WITH PROPOFOL N/A 10/02/2014   Procedure: COLONOSCOPY WITH PROPOFOL;  Surgeon: Josefine Class, MD;  Location: [facility] ENDOSCOPY;  Service: Endoscopy;  Laterality: N/A;   COLONOSCOPY WITH PROPOFOL N/A 05/20/2020   Procedure: COLONOSCOPY WITH PROPOFOL;  Surgeon: [name], MD;  Location: [facility] ENDOSCOPY;  Service: Gastroenterology;  Laterality: N/A;   ESOPHAGOGASTRODUODENOSCOPY (EGD) WITH PROPOFOL N/A 05/20/2020   Procedure: ESOPHAGOGASTRODUODENOSCOPY (EGD) WITH PROPOFOL;  Surgeon: [name], MD;  Location: [facility] ENDOSCOPY;  Service: Gastroenterology;  Laterality: N/A;   JOINT REPLACEMENT Right 2003     Family History  Problem Relation Age of Onset   Breast cancer Neg Hx   No bleeding disorders, clotting disorders, autoimmune diseases, or aneurysms    Social History   Tobacco Use   Smoking status: Some Days     Packs/day: 0.50    Years: 60.00    Total pack years: 30.00    Types: Cigarettes   Smokeless tobacco: Never  Vaping Use   Vaping Use: Never used  Substance Use Topics   Alcohol use: Never   Drug use: Never     Allergies  Allergen Reactions   Alendronate     Other reaction(s): Other (See Comments)    Current Outpatient Medications  Medication Sig Dispense Refill   Calcium Carbonate-Vit D-Min (CALCIUM 600+D PLUS MINERALS) 600-400 MG-UNIT TABS Take 1 tablet by mouth 2 (two) times daily.     citalopram (CELEXA) 10 MG tablet Take 10 mg by mouth daily.     dicyclomine (BENTYL) 10 MG capsule Take 1 capsule (10 mg total) by mouth 4 (four) times daily -  before meals and at bedtime. 30 capsule 1   ergocalciferol (VITAMIN D2) 1.25 MG (50000 UT) capsule Take by mouth.     loperamide (IMODIUM A-D) 2 MG tablet Take 2 mg by mouth 3 (three) times daily as needed.     doxycycline (VIBRAMYCIN) 100 MG capsule Take 100 mg by mouth 2 (two) times daily. (Patient not taking: Reported on 04/28/2022)     famotidine (PEPCID) 20 MG tablet Take 1 tablet (20 mg total) by mouth 2 (two) times daily. (Patient not taking: Reported on 04/28/2022) 60 tablet 0   SF 5000 PLUS 1.1 % CREA dental cream Take by mouth. (Patient not taking: Reported on 04/28/2022)     Sod Fluoride-Potassium Nitrate (PREVIDENT 5000  ENAMEL PROTECT) 1.1-5 % GEL After you floss, brush for two full minutes at night ONLY. Spit excess toothpaste out. (Patient not taking: Reported on 04/28/2022)     No current facility-administered medications for this visit.      REVIEW OF SYSTEMS (Negative unless checked)  Constitutional: []$ Weight loss  []$ Fever  []$ Chills Cardiac: []$ Chest pain   []$ Chest pressure   []$ Palpitations   []$ Shortness of breath when laying flat   []$ Shortness of breath at rest   []$ Shortness of breath with exertion. Vascular:  []$ Pain in legs with walking   []$ Pain in legs at rest   []$ Pain in legs when laying flat   []$ Claudication   []$ Pain in  feet when walking  []$ Pain in feet at rest  []$ Pain in feet when laying flat   []$ History of DVT   []$ Phlebitis   []$ Swelling in legs   []$ Varicose veins   []$ Non-healing ulcers Pulmonary:   []$ Uses home oxygen   []$ Productive cough   []$ Hemoptysis   []$ Wheeze  []$ COPD   []$ Asthma Neurologic:  []$ Dizziness  []$ Blackouts   []$ Seizures   []$ History of stroke   []$ History of TIA  []$ Aphasia   []$ Temporary blindness   []$ Dysphagia   []$ Weakness or numbness in arms   []$ Weakness or numbness in legs Musculoskeletal:  []$ Arthritis   []$ Joint swelling   []$ Joint pain   []$ Low back pain Hematologic:  []$ Easy bruising  []$ Easy bleeding   []$ Hypercoagulable state   []$ Anemic  []$ Hepatitis Gastrointestinal:  []$ Blood in stool   []$ Vomiting blood  []$ Gastroesophageal reflux/heartburn   []$ Abdominal pain Genitourinary:  [x]$ Chronic kidney disease   []$ Difficult urination  []$ Frequent urination  []$ Burning with urination   []$ Hematuria Skin:  []$ Rashes   []$ Ulcers   []$ Wounds Psychological:  []$ History of anxiety   []$  History of major depression.    Physical Exam BP 120/75 (BP Location: Right Arm)   Pulse 60   Resp 16   Wt 106 lb 12.8 oz (48.4 kg)   BMI 19.53 kg/m  Gen:   NAD, thin, appears younger than stated age. Head: [place]/AT, No temporalis wasting.  Ear/Nose/Throat: Hearing grossly intact, nares w/o erythema or drainage, oropharynx w/o Erythema/Exudate Eyes: Conjunctiva clear, sclera non-icteric  Neck: trachea midline.  No JVD.  Pulmonary:  Good air movement, respirations not labored, no use of accessory muscles  Cardiac: RRR, no JVD Vascular:  Vessel Right Left  Radial Palpable Palpable                          DP Palpable  Palpable   PT Palpable  Palpable    Gastrointestinal:. No masses, surgical incisions, or scars. Musculoskeletal: M/S 5/5 throughout.  Extremities without ischemic changes.  No deformity or atrophy. No edema. Neurologic: Sensation grossly intact in extremities.  Symmetrical.  Speech is fluent. Motor exam as listed  above. Psychiatric: Judgment intact, Mood & affect appropriate for pt's clinical situation. Dermatologic: No rashes or ulcers noted.  No cellulitis or open wounds.    Radiology No results found.  Labs No results found for this or any previous visit (from the past 2160 hour(s)).  Assessment/Plan:  PAD (peripheral artery disease) ([place]) To evaluate her perfusion, noninvasive studies were performed today demonstrating normal ABIs of 1.0 bilaterally with biphasic waveforms although her digital pressures were significantly reduced bilaterally consistent with small vessel disease.  No worrisome symptoms with this.  No role for intervention with small vessel disease.  Plan on follow-up in 1 year with noninvasive studies.  Smoking cessation would  be of benefit and lifestyle modifications with increasing exercise and diet regulation.  Hypertension blood pressure control important in reducing the progression of atherosclerotic disease. On appropriate oral medications.   Tobacco use Does represent an atherosclerotic risk factor and weight loss would be of benefit.      Leotis Pain 04/28/2022, 2:48 PM   This note was created with Dragon medical transcription system.  Any errors from dictation are unintentional.

## 2022-05-03 LAB — VAS US ABI WITH/WO TBI
Left ABI: 1.07
Right ABI: 1.06

## 2022-05-18 ENCOUNTER — Ambulatory Visit
Admission: RE | Admit: 2022-05-18 | Discharge: 2022-05-18 | Disposition: A | Discharge status: Home / Self Care | Payer: Medicare HMO | Source: Ambulatory Visit | Attending: Family Medicine | Admitting: Family Medicine

## 2022-05-18 DIAGNOSIS — Z1231 Encounter for screening mammogram for malignant neoplasm of breast: Secondary | ICD-10-CM | POA: Insufficient documentation

## 2022-06-16 ENCOUNTER — Telehealth: Payer: Self-pay | Admitting: Family Medicine

## 2022-06-22 ENCOUNTER — Telehealth: Payer: Self-pay

## 2022-06-22 NOTE — Telephone Encounter (Signed)
Patient called stating that her PCP had given her Lactulose 1 capful twice a day and ever sine she has a bowel movement 5-7 times a day. She also stated that she was given Imodium three times a day by her PCP as well in case the Lactulose gave her diarrhea. Patient wanted to know if she could take her Lactulose only once a day in the morning and see if that would work for her. I let her know that she should call her PCP and ask her since she was the one to prescribe it to her. Patient stated that she would call after talking to me. Patient stated that if she had further issues, that she would call us back.

## 2022-06-30 ENCOUNTER — Ambulatory Visit: Payer: Medicare HMO | Admitting: Family Medicine

## 2022-10-23 ENCOUNTER — Ambulatory Visit
Admission: RE | Admit: 2022-10-23 | Discharge: 2022-10-23 | Disposition: A | Discharge status: Home / Self Care | Payer: Medicare HMO | Source: Ambulatory Visit | Attending: Oncology | Admitting: Oncology

## 2022-10-23 DIAGNOSIS — R911 Solitary pulmonary nodule: Secondary | ICD-10-CM | POA: Insufficient documentation

## 2022-10-25 ENCOUNTER — Ambulatory Visit: Payer: Medicare HMO | Admitting: Oncology

## 2022-10-30 ENCOUNTER — Encounter: Payer: Self-pay | Admitting: Oncology

## 2022-10-30 ENCOUNTER — Inpatient Hospital Stay: Payer: Medicare HMO | Attending: Oncology | Admitting: Oncology

## 2022-10-30 VITALS — BP 124/72 | HR 58 | Temp 97.5°F | Resp 18 | Wt 103.7 lb

## 2022-10-30 DIAGNOSIS — F17210 Nicotine dependence, cigarettes, uncomplicated: Secondary | ICD-10-CM | POA: Insufficient documentation

## 2022-10-30 DIAGNOSIS — R911 Solitary pulmonary nodule: Secondary | ICD-10-CM | POA: Insufficient documentation

## 2022-10-30 DIAGNOSIS — Z720 Tobacco use: Secondary | ICD-10-CM

## 2022-10-30 DIAGNOSIS — R634 Abnormal weight loss: Secondary | ICD-10-CM | POA: Diagnosis not present

## 2022-10-30 DIAGNOSIS — Z79899 Other long term (current) drug therapy: Secondary | ICD-10-CM | POA: Diagnosis not present

## 2022-10-30 NOTE — Assessment & Plan Note (Signed)
Smoke cessation was discussed with patient.

## 2022-10-30 NOTE — Assessment & Plan Note (Addendum)
Her weight has been stable.  I will hold off additional work up

## 2022-10-30 NOTE — Assessment & Plan Note (Signed)
CT scan result was reviewed with patient.  Stable 4mm left upper lung nodule, and a new 4mm lingula nodule.  Given her smoking history, recommend her to repeat CT in 12 months.

## 2022-10-30 NOTE — Progress Notes (Signed)
Hematology/Oncology Consult note [phone] [phone]         Patient Care Team: Leanna Sato, MD as PCP - General (Family Medicine) Rickard Patience, MD as Consulting Physician (Oncology)  CHIEF COMPLAINTS/REASON FOR VISIT:  Lung nodule, weight loss   ASSESSMENT & PLAN:   Weight loss Her weight has been stable.  I will hold off additional work up   Tobacco use Smoke cessation was discussed with patient.  Lung nodule CT scan result was reviewed with patient.  Stable 4mm left upper lung nodule, and a new 4mm lingula nodule.  Given her smoking history, recommend her to repeat CT in 12 months.   Orders Placed This Encounter  Procedures   CT Chest Wo Contrast    Standing Status:   Future    Standing Expiration Date:   10/30/2023    Order Specific Question:   Preferred imaging location?    Answer:   [facility]    All questions were answered. The patient knows to call the clinic with any problems, questions or concerns.  Rickard Patience, MD, PhD [facility] Hematology Oncology 10/30/2022   HISTORY OF PRESENTING ILLNESS:   Michelle Kaufman is a  77 y.o.  female with PMH listed below was seen in consultation at the request of  Leanna Sato, MD  for evaluation of unintentional weight loss  Patient reports that she used to weigh 110 pounds.  Currently she weighs 99 pounds.  She does not have good appetite and does not eat much.,  Patient is convinced that something is not right with her.  Patient has had CT abdomen pelvis scan done which did not explain her chronic abdominal pain.  She has chronic constipation.  Chronic abdominal pain and she has followed up with gastroenterology.Last seen by Dr. Tobi Bastos on 09/20/2021.  Patient is recommended to have a repeat EGD for further evaluation.  She was also recommended to take lactulose for chronic constipation.  08/15/2021, CT abdomen pelvis with contrast showed no acute abnormality in abdomen/pelvis.  Moderate  colonic stool.  Subcentimeter low-density lesions in the kidneys, liver and spleen, too small to be accurately characterize.  Likely cyst.  No need for follow-up.    INTERVAL HISTORY Michelle Kaufman is a 77 y.o. female who has above history reviewed by me today presents for follow up visit for lung nodule, weight loss.  She continues to smoke on some days.  Weight has been relatively stable, She takes nutritional supplements, has gained weight in Feb 2024, today she has lost a few pounds.  She denies SOB, chest pain, hemoptysis  MEDICAL HISTORY:  Past Medical History:  Diagnosis Date   Chronic kidney disease    CKD, Stage 3   Hypercalcemia    Hypertension    Osteoporosis     SURGICAL HISTORY: Past Surgical History:  Procedure Laterality Date   COLONOSCOPY WITH PROPOFOL N/A 10/02/2014   Procedure: COLONOSCOPY WITH PROPOFOL;  Surgeon: Elnita Maxwell, MD;  Location: [facility] ENDOSCOPY;  Service: Endoscopy;  Laterality: N/A;   COLONOSCOPY WITH PROPOFOL N/A 05/20/2020   Procedure: COLONOSCOPY WITH PROPOFOL;  Surgeon: Wyline Mood, MD;  Location: [facility] ENDOSCOPY;  Service: Gastroenterology;  Laterality: N/A;   ESOPHAGOGASTRODUODENOSCOPY (EGD) WITH PROPOFOL N/A 05/20/2020   Procedure: ESOPHAGOGASTRODUODENOSCOPY (EGD) WITH PROPOFOL;  Surgeon: Wyline Mood, MD;  Location: [facility] ENDOSCOPY;  Service: Gastroenterology;  Laterality: N/A;   JOINT REPLACEMENT Right 2003    SOCIAL HISTORY: Social History   Socioeconomic History   Marital status:  Married    Spouse name: Not on file   Number of children: Not on file   Years of education: Not on file   Highest education level: Not on file  Occupational History   Not on file  Tobacco Use   Smoking status: Some Days    Current packs/day: 0.50    Average packs/day: 0.5 packs/day for 60.0 years (30.0 ttl pk-yrs)    Types: Cigarettes   Smokeless tobacco: Never  Vaping Use   Vaping status: Never Used  Substance and Sexual Activity   Alcohol use:  Never   Drug use: Never   Sexual activity: Not on file  Other Topics Concern   Not on file  Social History Narrative   Not on file   Social Determinants of Health   Financial Resource Strain: Not on file  Food Insecurity: Not on file  Transportation Needs: Not on file  Physical Activity: Not on file  Stress: Not on file  Social Connections: Not on file  Intimate Partner Violence: Not on file    FAMILY HISTORY: Family History  Problem Relation Age of Onset   Breast cancer Neg Hx     ALLERGIES:  is allergic to alendronate.  MEDICATIONS:  Current Outpatient Medications  Medication Sig Dispense Refill   Calcium Carbonate-Vit D-Min (CALCIUM 600+D PLUS MINERALS) 600-400 MG-UNIT TABS Take 1 tablet by mouth 2 (two) times daily.     citalopram (CELEXA) 10 MG tablet Take 10 mg by mouth daily.     dicyclomine (BENTYL) 10 MG capsule Take 1 capsule (10 mg total) by mouth 4 (four) times daily -  before meals and at bedtime. 30 capsule 1   ergocalciferol (VITAMIN D2) 1.25 MG (50000 UT) capsule Take by mouth.     lactulose (CHRONULAC) 10 GM/15ML solution Take 10 g by mouth 2 (two) times daily.     loperamide (IMODIUM A-D) 2 MG tablet Take 2 mg by mouth 3 (three) times daily as needed.     simethicone (MYLICON) 80 MG chewable tablet Chew 80 mg by mouth every 6 (six) hours as needed for flatulence.     No current facility-administered medications for this visit.    Review of Systems  Constitutional:  Positive for unexpected weight change. Negative for appetite change, chills, fatigue and fever.  HENT:   Negative for hearing loss and voice change.   Eyes:  Negative for eye problems.  Respiratory:  Negative for chest tightness and cough.   Cardiovascular:  Negative for chest pain.  Gastrointestinal:  Negative for abdominal distention, abdominal pain and blood in stool.  Endocrine: Negative for hot flashes.  Genitourinary:  Negative for difficulty urinating and frequency.    Musculoskeletal:  Negative for arthralgias.  Skin:  Negative for itching and rash.  Neurological:  Negative for extremity weakness.  Hematological:  Negative for adenopathy.  Psychiatric/Behavioral:  Negative for confusion.    PHYSICAL EXAMINATION: ECOG PERFORMANCE STATUS: 1 - Symptomatic but completely ambulatory Vitals:   10/30/22 1057  BP: 124/72  Pulse: (!) 58  Resp: 18  Temp: (!) 97.5 F (36.4 C)   Filed Weights   10/30/22 1057  Weight: 103 lb 11.2 oz (47 kg)    Physical Exam Constitutional:      General: She is not in acute distress.    Comments: Thin built  HENT:     Head: Normocephalic and atraumatic.  Eyes:     General: No scleral icterus. Cardiovascular:     Rate and Rhythm: Normal rate and  regular rhythm.  Pulmonary:     Effort: Pulmonary effort is normal. No respiratory distress.     Breath sounds: No wheezing.  Abdominal:     General: Bowel sounds are normal. There is no distension.     Palpations: Abdomen is soft.  Musculoskeletal:        General: No deformity. Normal range of motion.     Cervical back: Normal range of motion and neck supple.  Skin:    General: Skin is warm and dry.     Findings: No erythema or rash.  Neurological:     Mental Status: She is alert and oriented to person, place, and time. Mental status is at baseline.     Cranial Nerves: No cranial nerve deficit.     Coordination: Coordination normal.  Psychiatric:        Mood and Affect: Mood normal.     LABORATORY DATA:  I have reviewed the data as listed    Latest Ref Rng & Units 09/24/2021    2:55 PM 09/09/2021    7:15 AM 04/11/2020    9:13 PM  CBC  WBC 4.0 - 10.5 K/uL 5.2  3.9  6.6   Hemoglobin 12.0 - 15.0 g/dL 82.9  56.2  13.0   Hematocrit 36.0 - 46.0 % 46.3  48.6  44.3   Platelets 150 - 400 K/uL 235  242  216       Latest Ref Rng & Units 09/24/2021    2:55 PM 09/09/2021    7:15 AM 08/15/2021    8:08 AM  CMP  Glucose 70 - 99 mg/dL 89  88    BUN 8 - 23 mg/dL 11  15     Creatinine 8.65 - 1.00 mg/dL 7.84  6.96  2.95   Sodium 135 - 145 mmol/L 134  138    Potassium 3.5 - 5.1 mmol/L 4.8  3.7    Chloride 98 - 111 mmol/L 100  103    CO2 22 - 32 mmol/L 22  23    Calcium 8.9 - 10.3 mg/dL 9.6  28.4    Total Protein 6.5 - 8.1 g/dL 7.1     Total Bilirubin 0.3 - 1.2 mg/dL 1.0     Alkaline Phos 38 - 126 U/L 55     AST 15 - 41 U/L 23     ALT 0 - 44 U/L 9         RADIOGRAPHIC STUDIES: I have personally reviewed the radiological images as listed and agreed with the findings in the report. CT Chest Wo Contrast  Result Date: 10/26/2022 CLINICAL DATA:  Follow-up pulmonary nodule. EXAM: CT CHEST WITHOUT CONTRAST TECHNIQUE: Multidetector CT imaging of the chest was performed following the standard protocol without IV contrast. RADIATION DOSE REDUCTION: This exam was performed according to the departmental dose-optimization program which includes automated exposure control, adjustment of the mA and/or kV according to patient size and/or use of iterative reconstruction technique. COMPARISON:  10/18/2021 FINDINGS: Cardiovascular: The heart size is normal. No substantial pericardial effusion. Coronary artery calcification is evident. Coronary artery calcification is evident. Mild atherosclerotic calcification is noted in the wall of the thoracic aorta. Mediastinum/Nodes: No mediastinal lymphadenopathy. No evidence for gross hilar lymphadenopathy although assessment is limited by the lack of intravenous contrast on the current study. The esophagus has normal imaging features. There is no axillary lymphadenopathy. Lungs/Pleura: Stable left upper lobe pulmonary nodule measuring 4 mm today on image 37/4 compared to 5 mm reported previously. 4 mm nodule  in the lingula (87/4) is new in the interim. 3 mm right upper lobe nodule on 35/4 is unchanged. Stable appearance of the tubular branching opacity in the posterior right lung base consistent with impacted airway or potentially vascular  malformation. Dependent atelectasis noted in both lungs. Upper Abdomen: Punctate nonobstructing stone identified upper pole right kidney stable small hypoattenuating lesions in the upper pole left kidney approaches water density, compatible with cyst. Musculoskeletal: No worrisome lytic or sclerotic osseous abnormality. Sclerotic focus in the left third rib is stable, likely a bone island. IMPRESSION: 1. 4 mm left upper lobe pulmonary nodule identified previously is stable in the interval. There is a new 4 mm nodule seen in the lingula on today's study. Although likely benign, if the patient is high-risk, given the morphology and/or location of this nodule a non-contrast chest CT can be considered in 12 months.This recommendation follows the consensus statement: Guidelines for Management of Incidental Pulmonary Nodules Detected on CT Images: From the Fleischner Society 2017; Radiology 2017; 284:228-243. 2. Stable appearance of the tubular branching opacity in the posterior right lung base. This may be a an impacted peripheral airway/bronchocele or vascular malformation/varix. 3.  Aortic Atherosclerosis (ICD10-I70.0). Electronically Signed   By: Kennith Center M.D.   On: 10/26/2022 10:59

## 2022-12-20 ENCOUNTER — Telehealth: Payer: Self-pay | Admitting: Gastroenterology

## 2022-12-20 NOTE — Telephone Encounter (Signed)
Patient called in to see if we had an earlier appointment.

## 2022-12-26 NOTE — Progress Notes (Unsigned)
Michelle Amy, PA-C [address]  [address]  [place], [place] [address]  Main: [phone]  Fax: [phone]   Primary Care Physician: Leanna Sato, MD  Primary Gastroenterologist:  Michelle Amy, PA-C / Dr. Wyline Mood    CC: F/U chronic constipation  HPI: Michelle Kaufman is a 77 y.o. female returns for follow-up of chronic constipation.  History of constipation for many years.  She has provide various treatments including Linzess and lactulose which did not work well.  Currently she is taking MiraLAX 1 capful once daily every morning with great benefit.  On this treatment she is having a normal bowel movement daily or every other day.  She denies any hard stools or straining.  Weight has been stable over the past year.  She only eats 2 meals per day and does not eat much.  She denies rectal bleeding.  05/20/2020: Colonoscopy: 2 polyps 7 to 10 mm resected in the ascending colon and 2 other polyps 4 to 6 mm also resected in the same area.  A 15 mm polyp was also resected in the ascending colon an upper endoscopy was performed on the same day showed no abnormality except for gastric fluid.  The polyps were all tubular adenomas.  3 year repeat (due 05/2023).  05/2020 EGD by Dr Tobi Bastos: Normal.   She has had multiple negative CT scans.  Chest CT 10/2022:  Stable small pulmonary nodules. Abd / Pelvic CT w contrast 08/2021: Constipation; No other acute abnormality.   Current Outpatient Medications  Medication Sig Dispense Refill   Calcium Carbonate-Vit D-Min (CALCIUM 600+D PLUS MINERALS) 600-400 MG-UNIT TABS Take 1 tablet by mouth 2 (two) times daily.     ergocalciferol (VITAMIN D2) 1.25 MG (50000 UT) capsule Take by mouth.     No current facility-administered medications for this visit.    Allergies as of 12/27/2022 - Review Complete 12/27/2022  Allergen Reaction Noted   Alendronate  07/04/2016    Past Medical History:  Diagnosis Date   Chronic kidney disease    CKD, Stage 3    Hypercalcemia    Hypertension    Osteoporosis     Past Surgical History:  Procedure Laterality Date   COLONOSCOPY WITH PROPOFOL N/A 10/02/2014   Procedure: COLONOSCOPY WITH PROPOFOL;  Surgeon: Elnita Maxwell, MD;  Location[facility] ENDOSCOPY;  Service: Endoscopy;  Laterality: N/A;   COLONOSCOPY WITH PROPOFOL N/A 05/20/2020   Procedure: COLONOSCOPY WITH PROPOFOL;  Surgeon: Wyline Mood, MD;  Location: [facility] ENDOSCOPY;  Service: Gastroenterology;  Laterality: N/A;   ESOPHAGOGASTRODUODENOSCOPY (EGD) WITH PROPOFOL N/A 05/20/2020   Procedure: ESOPHAGOGASTRODUODENOSCOPY (EGD) WITH PROPOFOL;  Surgeon: Wyline Mood, MD;  Location: [facility] ENDOSCOPY;  Service: Gastroenterology;  Laterality: N/A;   JOINT REPLACEMENT Right 2003    Review of Systems:    All systems reviewed and negative except where noted in HPI.   Physical Examination:   BP 134/78   Pulse (!) 59   Temp 98.3 F (36.8 C)   Ht 5\' 2"  (1.575 m)   Wt 103 lb 12.8 oz (47.1 kg)   BMI 18.99 kg/m   General: Well-nourished, thin, well-developed in no acute distress.  Lungs: Clear to auscultation bilaterally. Non-labored. Heart: Regular rate and rhythm, no murmurs rubs or gallops.  Abdomen: Bowel sounds are normal; Abdomen is Soft; No hepatosplenomegaly, masses or hernias;  No Abdominal Tenderness; No guarding or rebound tenderness. Neuro: Alert and oriented x 3.  Grossly intact.  Psych: Alert and cooperative, normal mood and affect.  Imaging Studies: No results found.  Assessment and Plan:   Michelle Kaufman is a 77 y.o. y/o female returns for follow-up of chronic constipation.  1.  Chronic idiopathic constipation  Continue MiraLAX, mix 1 capful in a drink once daily every day.  Eat high-fiber diet with fruits, vegetables, whole grains.  30 g of fiber daily.  Drink 64 ounces of water and fluids daily.  2.  History of adenomatous colon polyps  3-year repeat colonoscopy will be due March 2025  Michelle Amy, PA-C  Follow up  in 4 months (February 2025) to follow-up with constipation and schedule repeat colonoscopy.

## 2022-12-27 ENCOUNTER — Ambulatory Visit: Payer: Medicare HMO | Admitting: Physician Assistant

## 2022-12-27 ENCOUNTER — Encounter: Payer: Self-pay | Admitting: Physician Assistant

## 2022-12-27 VITALS — BP 134/78 | HR 59 | Temp 98.3°F | Ht 62.0 in | Wt 103.8 lb

## 2022-12-27 DIAGNOSIS — Z860101 Personal history of adenomatous and serrated colon polyps: Secondary | ICD-10-CM

## 2022-12-27 DIAGNOSIS — K581 Irritable bowel syndrome with constipation: Secondary | ICD-10-CM

## 2022-12-27 DIAGNOSIS — K5904 Chronic idiopathic constipation: Secondary | ICD-10-CM

## 2022-12-27 NOTE — Patient Instructions (Signed)
Please Continue to  Take OTC Miralax (ClearLax) Powder Mix 1 Capful in a drink Every day.  Eat High Fiber diet with fruits, vegetables, whole wheat breads and cereals.

## 2023-02-14 ENCOUNTER — Telehealth: Payer: Self-pay | Admitting: Gastroenterology

## 2023-02-14 NOTE — Telephone Encounter (Signed)
Patient called in to schedule a follow up with Dr. Tobi Bastos. She said she is having bad pains in her stomach, and she went to see her PCP. The patient PCP advised her to schedule a follow up with Dr. Tobi Bastos. I offered her to see Inetta Fermo -PA tomorrow at 3:00 pm and she said that she will have to call back in 10 minutes.

## 2023-02-14 NOTE — Telephone Encounter (Signed)
She called back to book her appointment with Inetta Fermo.

## 2023-02-15 ENCOUNTER — Encounter: Payer: Self-pay | Admitting: Physician Assistant

## 2023-02-15 ENCOUNTER — Ambulatory Visit: Payer: Medicare HMO | Admitting: Physician Assistant

## 2023-02-15 ENCOUNTER — Other Ambulatory Visit: Payer: Self-pay

## 2023-02-15 VITALS — BP 122/75 | HR 77 | Temp 98.4°F | Ht 62.0 in | Wt 100.5 lb

## 2023-02-15 DIAGNOSIS — Z860101 Personal history of adenomatous and serrated colon polyps: Secondary | ICD-10-CM

## 2023-02-15 DIAGNOSIS — Z860100 Personal history of colon polyps, unspecified: Secondary | ICD-10-CM

## 2023-02-15 DIAGNOSIS — K5904 Chronic idiopathic constipation: Secondary | ICD-10-CM | POA: Diagnosis not present

## 2023-02-15 MED ORDER — NA SULFATE-K SULFATE-MG SULF 17.5-3.13-1.6 GM/177ML PO SOLN: 354.0000 mL | Freq: Once | ORAL | 0 refills | Status: AC

## 2023-02-15 NOTE — Progress Notes (Signed)
Celso Amy, PA-C [address]  [address]  [place], [place] [address]  Main: [phone]  Fax: [phone]   Primary Care Physician: Leanna Sato, MD  Primary Gastroenterologist:  Celso Amy, PA-C / Dr. Wyline Mood    CC: F/U Chronic Constipation  HPI: Michelle Kaufman is a 77 y.o. female returns for follow-up of chronic constipation.  History of constipation for many years.  She has provide various treatments including Linzess and lactulose which did not work well.  Currently she is taking MiraLAX 1 capful once daily. Last BM was 2 days ago.  She has chronic intermittent lower abdominal pain.  She feels like MiraLAX is not working well.  She has episodes of loose stool on MiraLAX.  Denies hard stools or straining.  She does not eat much.  Not much fiber in her diet.  She has anxiety.  Is worried something serious is causing her constipation and abdominal pain.  She is requesting to go ahead and schedule a repeat colonoscopy.  Weight has been stable over the past year.  She denies rectal bleeding.   05/20/2020: Colonoscopy: 2 polyps 7 to 10 mm resected in the ascending colon and 2 other polyps 4 to 6 mm also resected in the same area.  A 15 mm polyp was also resected in the ascending colon an upper endoscopy was performed on the same day showed no abnormality except for gastric fluid.  The polyps were all tubular adenomas.  3 year repeat (due 05/2023).   05/2020 EGD by Dr Tobi Bastos: Normal.   She has had multiple negative CT scans.   Chest CT 10/2022:  Stable small pulmonary nodules. Abd / Pelvic CT w contrast 08/2021: Constipation; No other acute abnormality.  Current Outpatient Medications  Medication Sig Dispense Refill   ergocalciferol (VITAMIN D2) 1.25 MG (50000 UT) capsule Take by mouth.     Na Sulfate-K Sulfate-Mg Sulf 17.5-3.13-1.6 GM/177ML SOLN Take 354 mLs by mouth once for 1 dose. 354 mL 0   Calcium Carbonate-Vit D-Min (CALCIUM 600+D PLUS MINERALS) 600-400 MG-UNIT TABS  Take 1 tablet by mouth 2 (two) times daily. (Patient not taking: Reported on 02/15/2023)     No current facility-administered medications for this visit.    Allergies as of 02/15/2023 - Review Complete 02/15/2023  Allergen Reaction Noted   Alendronate  07/04/2016    Past Medical History:  Diagnosis Date   Chronic kidney disease    CKD, Stage 3   Hypercalcemia    Hypertension    Osteoporosis     Past Surgical History:  Procedure Laterality Date   COLONOSCOPY WITH PROPOFOL N/A 10/02/2014   Procedure: COLONOSCOPY WITH PROPOFOL;  Surgeon: Elnita Maxwell, MD;  Location: [facility] ENDOSCOPY;  Service: Endoscopy;  Laterality: N/A;   COLONOSCOPY WITH PROPOFOL N/A 05/20/2020   Procedure: COLONOSCOPY WITH PROPOFOL;  Surgeon: Wyline Mood, MD;  Location: [facility] ENDOSCOPY;  Service: Gastroenterology;  Laterality: N/A;   ESOPHAGOGASTRODUODENOSCOPY (EGD) WITH PROPOFOL N/A 05/20/2020   Procedure: ESOPHAGOGASTRODUODENOSCOPY (EGD) WITH PROPOFOL;  Surgeon: Wyline Mood, MD;  Location[facility] ENDOSCOPY;  Service: Gastroenterology;  Laterality: N/A;   JOINT REPLACEMENT Right 2003    Review of Systems:    All systems reviewed and negative except where noted in HPI.   Physical Examination:   BP 122/75 (BP Location: Left Arm, Patient Position: Sitting, Cuff Size: Normal)   Pulse 77   Temp 98.4 F (36.9 C) (Oral)   Ht 5\' 2"  (1.575 m)   Wt 100 lb 8  oz (45.6 kg)   BMI 18.38 kg/m   General: Well-nourished, very thin elderly female, in no acute distress.  Lungs: Clear to auscultation bilaterally. Non-labored. Heart: Regular rate and rhythm, no murmurs rubs or gallops.  Abdomen: Bowel sounds are normal; Abdomen is Soft; No hepatosplenomegaly, masses or hernias;  No Abdominal Tenderness; No guarding or rebound tenderness. Neuro: Alert and oriented x 3.  Grossly intact.  Psych: Alert and cooperative, normal mood and affect.   Imaging Studies: No results found.  Assessment and Plan:   Michelle Kaufman  is a 77 y.o. y/o female returns for f/u of:  Chronic Idiopathic Constipation  Continue Miralax, 1 capful in a drink daily.  Start Benefiber or Metamucil 1-2 TBSP in a drink daily.  History of Adenomatous Colon Polyps  Scheduling Colonoscopy I discussed risks of colonoscopy with patient to include risk of bleeding, colon perforation, and risk of sedation.  Patient expressed understanding and agrees to proceed with colonoscopy.   Celso Amy, PA-C  Follow up in 3 months with TG.

## 2023-02-15 NOTE — Patient Instructions (Signed)
   For constipation: Take OTC Miralax Powder Mix 1 capful in 6 to 8 ounces of a drink once daily  Recommend high-fiber diet, 30 g of fiber daily Eat fruits, vegetables, and whole grains Drink 64 ounces of water / fluids daily.     Start OTC Benefiber Powder. Mix 1 - 2 Tablespoons in 6 - 8 ounces of a Drink Once Daily. Drink 64 ounces of water / fluids Daily.

## 2023-03-16 ENCOUNTER — Telehealth: Payer: Self-pay

## 2023-03-16 NOTE — Telephone Encounter (Signed)
 Per pt have [employer] insurance. Pt doesn't have card nor number. Per pt will call [employer] and get ID number and call me back at [phone].

## 2023-03-21 ENCOUNTER — Telehealth: Payer: Self-pay | Admitting: Physician Assistant

## 2023-03-21 NOTE — Telephone Encounter (Signed)
 The patient called in to check on the date for her procedure. I confirm that her procedure is 03/30/23 with Dr. Tobi Bastos.

## 2023-03-27 ENCOUNTER — Telehealth: Payer: Self-pay

## 2023-03-27 NOTE — Telephone Encounter (Signed)
 Per pt she has a stomachache and wants to know can she take pepto-bismol? Pt states her stool isn't loose it kind of hard. She wants to make sure what she can take . Her colonoscopy is on Friday.    Spoke with patient-let patient know she can take pepto-bismol.

## 2023-03-27 NOTE — Telephone Encounter (Signed)
 Per pt she has a stomachache and wants to know can she take pepto-bismol? Pt states her stool isn't loose it kind of hard. She wants to make sure what she can take . Her colonoscopy is on Friday.

## 2023-03-29 ENCOUNTER — Encounter: Payer: Self-pay | Admitting: Gastroenterology

## 2023-03-30 ENCOUNTER — Ambulatory Visit: Payer: No Typology Code available for payment source | Admitting: Anesthesiology

## 2023-03-30 ENCOUNTER — Ambulatory Visit
Admission: RE | Admit: 2023-03-30 | Discharge: 2023-03-30 | Disposition: A | Discharge status: Home / Self Care | Payer: No Typology Code available for payment source | Source: Ambulatory Visit | Attending: Gastroenterology | Admitting: Gastroenterology

## 2023-03-30 ENCOUNTER — Encounter
Admission: RE | Disposition: A | Discharge status: Home / Self Care | Payer: Self-pay | Source: Ambulatory Visit | Attending: Gastroenterology

## 2023-03-30 DIAGNOSIS — J449 Chronic obstructive pulmonary disease, unspecified: Secondary | ICD-10-CM | POA: Insufficient documentation

## 2023-03-30 DIAGNOSIS — I739 Peripheral vascular disease, unspecified: Secondary | ICD-10-CM | POA: Diagnosis not present

## 2023-03-30 DIAGNOSIS — D128 Benign neoplasm of rectum: Secondary | ICD-10-CM | POA: Diagnosis not present

## 2023-03-30 DIAGNOSIS — D126 Benign neoplasm of colon, unspecified: Secondary | ICD-10-CM

## 2023-03-30 DIAGNOSIS — Z1211 Encounter for screening for malignant neoplasm of colon: Secondary | ICD-10-CM

## 2023-03-30 DIAGNOSIS — K635 Polyp of colon: Secondary | ICD-10-CM

## 2023-03-30 DIAGNOSIS — N183 Chronic kidney disease, stage 3 unspecified: Secondary | ICD-10-CM | POA: Diagnosis not present

## 2023-03-30 DIAGNOSIS — I129 Hypertensive chronic kidney disease with stage 1 through stage 4 chronic kidney disease, or unspecified chronic kidney disease: Secondary | ICD-10-CM | POA: Diagnosis not present

## 2023-03-30 DIAGNOSIS — D122 Benign neoplasm of ascending colon: Secondary | ICD-10-CM

## 2023-03-30 DIAGNOSIS — Z860100 Personal history of colon polyps, unspecified: Secondary | ICD-10-CM

## 2023-03-30 HISTORY — PX: POLYPECTOMY: SHX5525

## 2023-03-30 HISTORY — PX: COLONOSCOPY WITH PROPOFOL: SHX5780

## 2023-03-30 SURGERY — COLONOSCOPY WITH PROPOFOL
Anesthesia: General

## 2023-03-30 MED ORDER — PHENYLEPHRINE 80 MCG/ML (10ML) SYRINGE FOR IV PUSH (FOR BLOOD PRESSURE SUPPORT)
PREFILLED_SYRINGE | INTRAVENOUS | Status: DC | PRN
  Administered 2023-03-30: 80 ug via INTRAVENOUS

## 2023-03-30 MED ORDER — PROPOFOL 10 MG/ML IV BOLUS
INTRAVENOUS | Status: AC
  Filled 2023-03-30: qty 20

## 2023-03-30 MED ORDER — PHENYLEPHRINE 80 MCG/ML (10ML) SYRINGE FOR IV PUSH (FOR BLOOD PRESSURE SUPPORT)
PREFILLED_SYRINGE | INTRAVENOUS | Status: AC
Start: 2023-03-30 — End: ?
  Filled 2023-03-30: qty 10

## 2023-03-30 MED ORDER — SODIUM CHLORIDE 0.9 % IV SOLN
INTRAVENOUS | Status: DC
Start: 2023-03-30 — End: 2023-03-30
  Administered 2023-03-30: 20 mL/h via INTRAVENOUS

## 2023-03-30 MED ORDER — PROPOFOL 10 MG/ML IV BOLUS
INTRAVENOUS | Status: DC | PRN
  Administered 2023-03-30: 50 ug/kg/min via INTRAVENOUS
  Administered 2023-03-30: 50 mg via INTRAVENOUS

## 2023-03-30 NOTE — Op Note (Signed)
[facility] Gastroenterology Patient Name: Michelle Kaufman Procedure Date: 03/30/2023 10:10 AM MRN: [id number] Account #: [id number] Date of Birth: 08-Nov-1945 Admit Type: Outpatient Age: 78 Room: [facility] ENDO ROOM 4 Gender: Female Note Status: Finalized Instrument Name: Peds Colonoscope [id number] Procedure:             Colonoscopy Indications:           Surveillance: Personal history of adenomatous polyps                         on last colonoscopy 3 years ago, Last colonoscopy:                         March 2022 Providers:             Wyline Mood MD, MD Referring MD:          Leanna Sato, MD (Referring MD) Medicines:             Monitored Anesthesia Care Complications:         No immediate complications. Procedure:             Pre-Anesthesia Assessment:                        - Prior to the procedure, a History and Physical was                         performed, and patient medications, allergies and                         sensitivities were reviewed. The patient's tolerance                         of previous anesthesia was reviewed.                        - The risks and benefits of the procedure and the                         sedation options and risks were discussed with the                         patient. All questions were answered and informed                         consent was obtained.                        - ASA Grade Assessment: II - A patient with mild                         systemic disease.                        After obtaining informed consent, the colonoscope was                         passed under direct vision. Throughout the procedure,                         the patient's  blood pressure, pulse, and oxygen                         saturations were monitored continuously. The                         Colonoscope was introduced through the anus and                         advanced to the the cecum, identified by the                          appendiceal orifice. The colonoscopy was performed                         with ease. The patient tolerated the procedure well.                         The quality of the bowel preparation was excellent.                         The ileocecal valve, appendiceal orifice, and rectum                         were photographed. Findings:      The perianal and digital rectal examinations were normal.      Three sessile polyps were found in the ascending colon. The polyps were       4 to 5 mm in size. These polyps were removed with a cold snare.       Resection and retrieval were complete.      A 6 mm polyp was found in the rectum. The polyp was sessile. The polyp       was removed with a cold snare. Resection and retrieval were complete.      The exam was otherwise without abnormality on direct and retroflexion       views. Impression:            - Three 4 to 5 mm polyps in the ascending colon,                         removed with a cold snare. Resected and retrieved.                        - One 6 mm polyp in the rectum, removed with a cold                         snare. Resected and retrieved.                        - The examination was otherwise normal on direct and                         retroflexion views. Recommendation:        - Discharge patient to home (with escort).                        - Resume previous diet.                        -  Continue present medications.                        - Await pathology results.                        - Repeat colonoscopy is not recommended due to current                         age (24 years or older) for surveillance. Procedure Code(s):     --- Professional ---                        [phone], Colonoscopy, flexible; with removal of                         tumor(s), polyp(s), or other lesion(s) by snare                         technique Diagnosis Code(s):     --- Professional ---                        Z86.010, Personal history of colonic  polyps                        D12.2, Benign neoplasm of ascending colon                        D12.8, Benign neoplasm of rectum CPT copyright 2022 American Medical Association. All rights reserved. The codes documented in this report are preliminary and upon coder review may  be revised to meet current compliance requirements. Wyline Mood, MD Wyline Mood MD, MD 03/30/2023 10:40:16 AM This report has been signed electronically. Number of Addenda: 0 Note Initiated On: 03/30/2023 10:10 AM Scope Withdrawal Time: 0 hours 10 minutes 29 seconds  Total Procedure Duration: 0 hours 14 minutes 10 seconds  Estimated Blood Loss:  Estimated blood loss: none.      [facility]

## 2023-03-30 NOTE — Transfer of Care (Signed)
Immediate Anesthesia Transfer of Care Note  Patient: Michelle Kaufman  Procedure(s) Performed: COLONOSCOPY WITH PROPOFOL POLYPECTOMY  Patient Location: PACU  Anesthesia Type:MAC  Level of Consciousness: awake  Airway & Oxygen Therapy: Patient Spontanous Breathing  Post-op Assessment: Report given to RN and Post -op Vital signs reviewed and stable  Post vital signs: Reviewed and stable  Last Vitals:  Vitals Value Taken Time  BP 112/77 03/30/23 1042  Temp    Pulse    Resp 15 03/30/23 1043  SpO2    Vitals shown include unfiled device data.  Last Pain:  Vitals:   03/30/23 0901  TempSrc: Temporal  PainSc: 0-No pain         Complications: No notable events documented.

## 2023-03-30 NOTE — Anesthesia Postprocedure Evaluation (Signed)
Anesthesia Post Note  Patient: Michelle Kaufman  Procedure(s) Performed: COLONOSCOPY WITH PROPOFOL POLYPECTOMY  Patient location during evaluation: PACU Anesthesia Type: General Level of consciousness: awake and alert Pain management: pain level controlled Vital Signs Assessment: post-procedure vital signs reviewed and stable Respiratory status: spontaneous breathing and nonlabored ventilation Cardiovascular status: blood pressure returned to baseline Anesthetic complications: no   No notable events documented.   Last Vitals:  Vitals:   03/30/23 1052 03/30/23 1102  BP: 126/76 136/78  Pulse: 73 80  Resp: 16 15  Temp:    SpO2: 98% 100%    Last Pain:  Vitals:   03/30/23 1102  TempSrc:   PainSc: 0-No pain                 VAN STAVEREN,Illiana Losurdo

## 2023-03-30 NOTE — H&P (Signed)
Wyline Mood, MD [address], [place], [place], [address] [address], [place], [place], [address] Phone: [phone]  Fax: [phone]  Primary Care Physician:  Leanna Sato, MD   Pre-Procedure History & Physical: HPI:  Michelle Kaufman is a 78 y.o. female is here for an colonoscopy.   Past Medical History:  Diagnosis Date   Chronic kidney disease    CKD, Stage 3   Hypercalcemia    Hypertension    Osteoporosis     Past Surgical History:  Procedure Laterality Date   COLONOSCOPY WITH PROPOFOL N/A 10/02/2014   Procedure: COLONOSCOPY WITH PROPOFOL;  Surgeon: Elnita Maxwell, MD;  Location: [facility] ENDOSCOPY;  Service: Endoscopy;  Laterality: N/A;   COLONOSCOPY WITH PROPOFOL N/A 05/20/2020   Procedure: COLONOSCOPY WITH PROPOFOL;  Surgeon: Wyline Mood, MD;  Location: [facility] ENDOSCOPY;  Service: Gastroenterology;  Laterality: N/A;   ESOPHAGOGASTRODUODENOSCOPY (EGD) WITH PROPOFOL N/A 05/20/2020   Procedure: ESOPHAGOGASTRODUODENOSCOPY (EGD) WITH PROPOFOL;  Surgeon: Wyline Mood, MD;  Location: [facility] ENDOSCOPY;  Service: Gastroenterology;  Laterality: N/A;   JOINT REPLACEMENT Right 2003    Prior to Admission medications   Medication Sig Start Date End Date Taking? Authorizing Provider  Calcium Carbonate-Vit D-Min (CALCIUM 600+D PLUS MINERALS) 600-400 MG-UNIT TABS Take 1 tablet by mouth 2 (two) times daily. 01/31/21  Yes [provider]  ergocalciferol (VITAMIN D2) 1.25 MG (50000 UT) capsule Take by mouth.   Yes [provider]    Allergies as of 02/15/2023 - Review Complete 02/15/2023  Allergen Reaction Noted   Alendronate  07/04/2016    Family History  Problem Relation Age of Onset   Breast cancer Neg Hx     Social History   Socioeconomic History   Marital status: Married    Spouse name: Not on file   Number of children: Not on file   Years of education: Not on file   Highest education level: Not on file  Occupational History    Not on file  Tobacco Use   Smoking status: Some Days    Current packs/day: 0.50    Average packs/day: 0.5 packs/day for 60.0 years (30.0 ttl pk-yrs)    Types: Cigarettes   Smokeless tobacco: Never  Vaping Use   Vaping status: Never Used  Substance and Sexual Activity   Alcohol use: Never   Drug use: Never   Sexual activity: Not on file  Other Topics Concern   Not on file  Social History Narrative   Not on file   Social Drivers of Health   Financial Resource Strain: Not on file  Food Insecurity: Not on file  Transportation Needs: Not on file  Physical Activity: Not on file  Stress: Not on file  Social Connections: Not on file  Intimate Partner Violence: Not on file    Review of Systems: See HPI, otherwise negative ROS  Physical Exam: BP 111/72   Pulse (!) 106   Temp (!) 97.2 F (36.2 C) (Temporal)   Resp 20   Ht 5\' 1"  (1.549 m)   Wt 43.2 kg   SpO2 97%   BMI 17.99 kg/m  General:   Alert,  pleasant and cooperative in NAD Head:  Normocephalic and atraumatic. Neck:  Supple; no masses or thyromegaly. Lungs:  Clear throughout to auscultation, normal respiratory effort.    Heart:  +S1, +S2, Regular rate and rhythm, No edema. Abdomen:  Soft, nontender and nondistended. Normal bowel sounds, without guarding, and without rebound.   Neurologic:  Alert  and  oriented x4;  grossly normal neurologically.  Impression/Plan: Michelle Kaufman is here for an colonoscopy to be performed for surveillance due to prior history of colon polyps   Risks, benefits, limitations, and alternatives regarding  colonoscopy have been reviewed with the patient.  Questions have been answered.  All parties agreeable.   Wyline Mood, MD  03/30/2023, 9:41 AM

## 2023-03-30 NOTE — Anesthesia Preprocedure Evaluation (Signed)
Anesthesia Evaluation  Patient identified by MRN, date of birth, ID band Patient awake    Reviewed: Allergy & Precautions, NPO status , Patient's Chart, lab work & pertinent test results  Airway Mallampati: III  TM Distance: <3 FB Neck ROM: full  Mouth opening: Limited Mouth Opening  Dental  (+) Lower Dentures, Upper Dentures   Pulmonary neg pulmonary ROS, COPD, Current Smoker and Patient abstained from smoking.   Pulmonary exam normal  + decreased breath sounds      Cardiovascular Exercise Tolerance: Good hypertension, Pt. on medications + Peripheral Vascular Disease  negative cardio ROS Normal cardiovascular exam Rhythm:Regular Rate:Normal     Neuro/Psych negative neurological ROS  negative psych ROS   GI/Hepatic negative GI ROS, Neg liver ROS,GERD  ,,  Endo/Other  negative endocrine ROS    Renal/GU CRFRenal diseasenegative Renal ROS  negative genitourinary   Musculoskeletal   Abdominal  (+) + scaphoid   Peds negative pediatric ROS (+)  Hematology negative hematology ROS (+)   Anesthesia Other Findings Past Medical History: No date: Chronic kidney disease     Comment:  CKD, Stage 3 No date: Hypercalcemia No date: Hypertension No date: Osteoporosis  Past Surgical History: 10/02/2014: COLONOSCOPY WITH PROPOFOL; N/A     Comment:  Procedure: COLONOSCOPY WITH PROPOFOL;  Surgeon: Elnita Maxwell, MD;  Location: [facility] ENDOSCOPY;  Service:               Endoscopy;  Laterality: N/A; 05/20/2020: COLONOSCOPY WITH PROPOFOL; N/A     Comment:  Procedure: COLONOSCOPY WITH PROPOFOL;  Surgeon: Wyline Mood, MD;  Location: [facility] ENDOSCOPY;  Service:               Gastroenterology;  Laterality: N/A; 05/20/2020: ESOPHAGOGASTRODUODENOSCOPY (EGD) WITH PROPOFOL; N/A     Comment:  Procedure: ESOPHAGOGASTRODUODENOSCOPY (EGD) WITH               PROPOFOL;  Surgeon: Wyline Mood, MD;  Location: [facility]                ENDOSCOPY;  Service: Gastroenterology;  Laterality: N/A; 2003: JOINT REPLACEMENT; Right  BMI    Body Mass Index: 17.99 kg/m      Reproductive/Obstetrics negative OB ROS                             Anesthesia Physical Anesthesia Plan  ASA: 3  Anesthesia Plan: General   Post-op Pain Management:    Induction: Intravenous  PONV Risk Score and Plan: Propofol infusion and TIVA  Airway Management Planned: Natural Airway and Nasal Cannula  Additional Equipment:   Intra-op Plan:   Post-operative Plan:   Informed Consent: I have reviewed the patients History and Physical, chart, labs and discussed the procedure including the risks, benefits and alternatives for the proposed anesthesia with the patient or authorized representative who has indicated his/her understanding and acceptance.     Dental Advisory Given  Plan Discussed with: CRNA and Surgeon  Anesthesia Plan Comments:        Anesthesia Quick Evaluation

## 2023-04-02 ENCOUNTER — Encounter: Payer: Self-pay | Admitting: Gastroenterology

## 2023-04-02 ENCOUNTER — Telehealth: Payer: Self-pay

## 2023-04-02 LAB — SURGICAL PATHOLOGY

## 2023-04-02 NOTE — Telephone Encounter (Signed)
Patient is calling because she states Dr. Tobi Bastos told her he would be giving her samples for constipation. She had her colonoscopy on Friday. She was calling to see if they are ready because her niece will pick them up

## 2023-04-02 NOTE — Telephone Encounter (Signed)
Samples will be left at the front desk. Patient was notified.

## 2023-04-02 NOTE — Telephone Encounter (Signed)
The patient wants to know if the sample are ready for pick.

## 2023-04-06 ENCOUNTER — Encounter: Payer: Self-pay | Admitting: Gastroenterology

## 2023-04-16 ENCOUNTER — Telehealth: Payer: Self-pay

## 2023-04-16 NOTE — Telephone Encounter (Signed)
Spoke with patient-Tell patient to Add Benefiber powder 2 tablespoons in a drink once daily. Also continue Miralax every day.  She can mix Benefiber and Miralax in the same drink. I am not worried about Green stools.  This can come from her diet.  Avoid drinking blue drinks such as blue gatorade which can cause green stools. Celso Amy, PA-C

## 2023-04-16 NOTE — Telephone Encounter (Signed)
Patient states she is having Miralax every morning with Coffee. Patient states when she does this she will have a little bowel movement every morning but then still feel like she needs to go through out the day. She states that her bowel movement is always green and that worries her. She states she was given Linzess 72 samples and does not really think they helped her. She states that she didn't have any more bowel movements with this. She wants to know know should she get a prescription for the Linzess or should she just take the Miralax. Also wants to know why her stool is green.

## 2023-04-16 NOTE — Telephone Encounter (Signed)
 Patient verbalized understanding of instructions

## 2023-04-30 ENCOUNTER — Other Ambulatory Visit (INDEPENDENT_AMBULATORY_CARE_PROVIDER_SITE_OTHER): Payer: Self-pay | Admitting: Vascular Surgery

## 2023-04-30 DIAGNOSIS — I739 Peripheral vascular disease, unspecified: Secondary | ICD-10-CM

## 2023-05-01 ENCOUNTER — Encounter (INDEPENDENT_AMBULATORY_CARE_PROVIDER_SITE_OTHER): Payer: Medicare HMO

## 2023-05-01 ENCOUNTER — Ambulatory Visit (INDEPENDENT_AMBULATORY_CARE_PROVIDER_SITE_OTHER): Payer: Medicare HMO | Admitting: Vascular Surgery

## 2023-05-10 ENCOUNTER — Other Ambulatory Visit: Payer: Self-pay | Admitting: Internal Medicine

## 2023-05-10 ENCOUNTER — Telehealth: Payer: Self-pay | Admitting: Physician Assistant

## 2023-05-10 DIAGNOSIS — Z1231 Encounter for screening mammogram for malignant neoplasm of breast: Secondary | ICD-10-CM

## 2023-05-10 NOTE — Telephone Encounter (Signed)
 Pt requesting call back did not leave any more info

## 2023-05-14 NOTE — Telephone Encounter (Signed)
 Questions answered- appointment scheduled 3-18/25 @ 9;15 am.

## 2023-05-23 ENCOUNTER — Other Ambulatory Visit: Payer: Self-pay

## 2023-05-28 NOTE — Progress Notes (Unsigned)
 Celso Amy, PA-C [address]  [address]  [place], [place] [address]  Main: [phone]  Fax: [phone]   Primary Care Physician: Patient, No Pcp Per  Primary Gastroenterologist:  Celso Amy, PA-C / Dr. Wyline Mood    CC: Follow-up chronic constipation and history of colon polyps  HPI: Michelle Kaufman is a 78 y.o. female returns for 42-month follow-up of chronic constipation.  Takes MiraLAX 1 capful daily.  Has chronic intermittent abdominal pain attributed to constipation.  Tried and failed Linzess and lactulose.  She could not find Benefiber.  She tried Citrucel and did not like it.  Currently having 2 - 3 small formed Bms daily with mild straining.  Has a lot of anxiety which is a trigger for GI upset.  03/30/2023 Colonoscopy by Dr. Tobi Bastos: 4 small (4 mm to 6 mm) tubular adenoma polyps removed.  Excellent prep.  No further colonoscopies were recommended due to advanced age.  05/20/2020: Colonoscopy: 2 polyps 7 to 10 mm resected in the ascending colon and 2 other polyps 4 to 6 mm also resected in the same area.  A 15 mm polyp was also resected in the ascending colon an upper endoscopy was performed on the same day showed no abnormality except for gastric fluid.  The polyps were all tubular adenomas.  3 year repeat (due 05/2023).   05/2020 EGD by Dr Tobi Bastos: Normal.   She has had multiple negative CT scans.   Chest CT 10/2022:  Stable small pulmonary nodules. Abd / Pelvic CT w contrast 08/2021: Constipation; No other acute abnormality.  Current Outpatient Medications  Medication Sig Dispense Refill   Calcium Carbonate-Vit D-Min (CALCIUM 600+D PLUS MINERALS) 600-400 MG-UNIT TABS Take 1 tablet by mouth 2 (two) times daily.     polyethylene glycol powder (GLYCOLAX/MIRALAX) 17 GM/SCOOP powder Take by mouth once.     No current facility-administered medications for this visit.    Allergies as of 05/29/2023 - Review Complete 05/29/2023  Allergen Reaction Noted   Alendronate   07/04/2016    Past Medical History:  Diagnosis Date   Chronic kidney disease    CKD, Stage 3   Hypercalcemia    Hypertension    Osteoporosis     Past Surgical History:  Procedure Laterality Date   COLONOSCOPY WITH PROPOFOL N/A 10/02/2014   Procedure: COLONOSCOPY WITH PROPOFOL;  Surgeon: Elnita Maxwell, MD;  Location: [facility] ENDOSCOPY;  Service: Endoscopy;  Laterality: N/A;   COLONOSCOPY WITH PROPOFOL N/A 05/20/2020   Procedure: COLONOSCOPY WITH PROPOFOL;  Surgeon: Wyline Mood, MD;  Location: [facility] ENDOSCOPY;  Service: Gastroenterology;  Laterality: N/A;   COLONOSCOPY WITH PROPOFOL N/A 03/30/2023   Procedure: COLONOSCOPY WITH PROPOFOL;  Surgeon: Wyline Mood, MD;  Location: [facility] ENDOSCOPY;  Service: Gastroenterology;  Laterality: N/A;   ESOPHAGOGASTRODUODENOSCOPY (EGD) WITH PROPOFOL N/A 05/20/2020   Procedure: ESOPHAGOGASTRODUODENOSCOPY (EGD) WITH PROPOFOL;  Surgeon: Wyline Mood, MD;  Location: [facility] ENDOSCOPY;  Service: Gastroenterology;  Laterality: N/A;   JOINT REPLACEMENT Right 2003   POLYPECTOMY  03/30/2023   Procedure: POLYPECTOMY;  Surgeon: Wyline Mood, MD;  Location: [facility] ENDOSCOPY;  Service: Gastroenterology;;    Review of Systems:    All systems reviewed and negative except where noted in HPI.   Physical Examination:   BP 118/71   Pulse 66   Temp 98.5 F (36.9 C)   Ht 5\' 2"  (1.575 m)   Wt 102 lb 9.6 oz (46.5 kg)   BMI 18.77 kg/m   General: Well-nourished, well-developed in no acute  distress.  Neuro: Alert and oriented x 3.  Grossly intact.  Psych: Alert and cooperative, Anxious mood and affect.   Imaging Studies: No results found.  Assessment and Plan:   Michelle Kaufman is a 78 y.o. y/o female returns for follow-up of:  1.  Chronic idiopathic constipation / Irritable Bowel Syndrome - C  Continue MiraLAX, 1 - 2 cafuls daily.  She tried and failed Linzess and lactulose in the past  Discussed constipation treatment at length. Recommend High Fiber diet with  fruits, vegetables, and whole grains. Drink 64 ounces of Fluids Daily. Start Benefiber Mix 1 TBSP in a drink daily. Reassurance regarding normal GI tests.   No further GI tests are recommended. Rec. She f/u with PCP to treat anxiety.  2.  History of adenomatous colon polyps  No further surveillance colonoscopies are recommended due to advanced age  Celso Amy, PA-C  Follow up As Needed.

## 2023-05-29 ENCOUNTER — Ambulatory Visit (INDEPENDENT_AMBULATORY_CARE_PROVIDER_SITE_OTHER): Payer: No Typology Code available for payment source | Admitting: Physician Assistant

## 2023-05-29 ENCOUNTER — Encounter: Payer: Self-pay | Admitting: Physician Assistant

## 2023-05-29 ENCOUNTER — Ambulatory Visit
Admission: RE | Admit: 2023-05-29 | Discharge: 2023-05-29 | Disposition: A | Discharge status: Home / Self Care | Payer: No Typology Code available for payment source | Source: Ambulatory Visit | Attending: Internal Medicine | Admitting: Internal Medicine

## 2023-05-29 VITALS — BP 118/71 | HR 66 | Temp 98.5°F | Ht 62.0 in | Wt 102.6 lb

## 2023-05-29 DIAGNOSIS — Z860101 Personal history of adenomatous and serrated colon polyps: Secondary | ICD-10-CM | POA: Diagnosis not present

## 2023-05-29 DIAGNOSIS — K5904 Chronic idiopathic constipation: Secondary | ICD-10-CM | POA: Diagnosis not present

## 2023-05-29 DIAGNOSIS — Z1231 Encounter for screening mammogram for malignant neoplasm of breast: Secondary | ICD-10-CM | POA: Insufficient documentation

## 2023-05-29 DIAGNOSIS — K581 Irritable bowel syndrome with constipation: Secondary | ICD-10-CM | POA: Diagnosis not present

## 2023-05-29 NOTE — Patient Instructions (Signed)
 Recommend High Fiber diet with fruits, vegetables, and whole grains. Drink 64 ounces of Fluids Daily.

## 2023-06-11 ENCOUNTER — Telehealth: Payer: Self-pay

## 2023-06-11 ENCOUNTER — Telehealth: Payer: Self-pay | Admitting: Physician Assistant

## 2023-06-11 NOTE — Telephone Encounter (Signed)
 The patient called in to speak to [facility].

## 2023-06-11 NOTE — Telephone Encounter (Signed)
 Patient called office at this time- she had questions regarding the bone density test, Pnemonia vaccine and flu- she was directed to call her PCP-Michelle Kaufman [place]- she had questions about the Fibercon- her questions were answered- also reminded patient to drink 64 oz of fluid daily.

## 2023-06-18 ENCOUNTER — Other Ambulatory Visit: Payer: Self-pay | Admitting: Internal Medicine

## 2023-06-18 DIAGNOSIS — Z13820 Encounter for screening for osteoporosis: Secondary | ICD-10-CM

## 2023-06-22 ENCOUNTER — Telehealth: Payer: Self-pay

## 2023-06-22 NOTE — Telephone Encounter (Signed)
 She wanted to let us know she is taking miralax daily and she is having some green stools-she is seeing her PCP Tuesday. I let her know that its normal for your stools to have color.

## 2023-06-25 ENCOUNTER — Telehealth: Payer: Self-pay

## 2023-06-25 NOTE — Telephone Encounter (Signed)
 Patient stated she went to Urgent care Friday night due to cramping and abdominal pain and dark green stools- she was prescribed Azithromycin 500 mg x 3 days- for possible GI infection- she denies nausea. Vomiting. Fever and chills-she is wanting to know if there is anything to be concerned about-she is seeing her PCP tomorrow but feels that this may not be addressed-patient denies eating dark green foods.

## 2023-06-26 NOTE — Telephone Encounter (Signed)
 Spoke with patient to let her know it could be bile and to continue with the meds. She has PCP appointment today and will call back if anything changes.

## 2023-08-17 ENCOUNTER — Telehealth: Payer: Self-pay

## 2023-08-17 NOTE — Telephone Encounter (Signed)
 Spoke with patient-she stated she still has green stool at times- and I reminded her that Dr.Anna said it could be related to her diet. Continue with the Miralax and hydrate well.

## 2023-08-20 NOTE — Telephone Encounter (Signed)
 Error

## 2023-10-30 ENCOUNTER — Ambulatory Visit: Admission: RE | Admit: 2023-10-30 | Payer: Medicare HMO | Source: Ambulatory Visit

## 2023-11-05 ENCOUNTER — Telehealth: Payer: Self-pay | Admitting: Oncology

## 2023-11-05 ENCOUNTER — Encounter: Payer: Self-pay | Admitting: Oncology

## 2023-11-05 ENCOUNTER — Telehealth: Payer: Self-pay

## 2023-11-05 DIAGNOSIS — R911 Solitary pulmonary nodule: Secondary | ICD-10-CM

## 2023-11-05 NOTE — Telephone Encounter (Signed)
 Pt no showed to CT in August. Please r/s CT and r/s MD approx 1 week after CT. Please notify pt of appt update.

## 2023-11-05 NOTE — Telephone Encounter (Signed)
 Called pt in response to inbasket message saying the CT and MD follow-up needed to be r/s. Pt stated that she already had the CT done at [facility] in [place]. I asked for the number for the facility that she had the scan done at and she provided [phone] [facility]). Called facility and they said that pt had Echo and EKG on 8/11. Called pt back to r/s CT and MD follow-up. Pt asked me to call her niece Deloria) to make the appts. Called the niece, made the appts, and called pt back to confirm appt times with her as well. Sent letter in the mail for appt reminders. - LH

## 2023-11-05 NOTE — Telephone Encounter (Signed)
 Per Tinnie DEL, pt has been r/s

## 2023-11-06 ENCOUNTER — Inpatient Hospital Stay: Payer: Medicare HMO | Admitting: Oncology

## 2023-11-07 ENCOUNTER — Telehealth: Payer: Self-pay | Admitting: Oncology

## 2023-11-07 NOTE — Telephone Encounter (Signed)
 Pt called and left vm to talk about maybe r/s appt - called her back and spoke w/pt, but she decided to keep the current CT date - said she will let us  know by Friday if she needs to r/s - [facility]

## 2023-11-13 ENCOUNTER — Ambulatory Visit
Admission: RE | Admit: 2023-11-13 | Discharge: 2023-11-13 | Disposition: A | Discharge status: Home / Self Care | Source: Ambulatory Visit | Attending: Oncology | Admitting: Oncology

## 2023-11-13 DIAGNOSIS — R911 Solitary pulmonary nodule: Secondary | ICD-10-CM | POA: Insufficient documentation

## 2023-11-29 ENCOUNTER — Telehealth: Payer: Self-pay | Admitting: Oncology

## 2023-11-29 NOTE — Telephone Encounter (Signed)
 Per secure chat msg, pt wants to r/s appts to a Monday or Tuesday. I called pt back and spoke with pt niece and she asked for a Monday/Tuesday in October. Appts have been r/s to next available

## 2023-12-03 NOTE — Telephone Encounter (Signed)
 Pt called and stated her niece called her with a new appt date. Pt stated that she wanted to leave her appt as it was previously scheduled. (9/24). I looked at the MD schedule and that day is completely booked. I looked for any appt sooner than the 10/20 appt and there is nothing sooner.  Pt is not happy about this.  Pt stated that whoever she talked to, she told them to call her niece. I have documented that I spoke with her niece and confirmed the new appt.

## 2023-12-05 ENCOUNTER — Ambulatory Visit: Admitting: Oncology

## 2023-12-17 ENCOUNTER — Telehealth: Payer: Self-pay | Admitting: Oncology

## 2023-12-17 ENCOUNTER — Telehealth: Payer: Self-pay

## 2023-12-17 NOTE — Telephone Encounter (Signed)
 Spoke to Tanisha Shahin (niece/POA) and informed her that Dr. Babara recommends for patients recommends to reach out to GI, since her symptoms are chronic. If she is having severe symptoms then she should consider ER. Tanisha verbalized understanding.

## 2023-12-17 NOTE — Telephone Encounter (Signed)
 Patient called stated her lower abdominal stomach pain is worsening and she can't wait till the 20th to see Dr.Yu. Denies blood in stool/ N/V. She tried Miralax, no success and GI doctor is not helping with new recommendations. Also concerns of dizziness on and off. Requests if her appoiment can be moved up to Wed 10/8 or Thurs 10/9. Or [facility]? She can't come in today or tomorrow due to transportation.  Please advise

## 2023-12-17 NOTE — Telephone Encounter (Signed)
 Pt called and said that she needed to r/s her appt to be sooner due to symptoms - [facility]

## 2023-12-31 ENCOUNTER — Inpatient Hospital Stay: Attending: Oncology | Admitting: Oncology

## 2023-12-31 ENCOUNTER — Encounter: Payer: Self-pay | Admitting: Oncology

## 2023-12-31 VITALS — BP 110/53 | HR 43 | Temp 97.2°F | Resp 18 | Wt 103.7 lb

## 2023-12-31 DIAGNOSIS — R109 Unspecified abdominal pain: Secondary | ICD-10-CM | POA: Insufficient documentation

## 2023-12-31 DIAGNOSIS — Z720 Tobacco use: Secondary | ICD-10-CM | POA: Diagnosis not present

## 2023-12-31 DIAGNOSIS — G8929 Other chronic pain: Secondary | ICD-10-CM | POA: Diagnosis not present

## 2023-12-31 DIAGNOSIS — R634 Abnormal weight loss: Secondary | ICD-10-CM | POA: Insufficient documentation

## 2023-12-31 DIAGNOSIS — R911 Solitary pulmonary nodule: Secondary | ICD-10-CM | POA: Diagnosis not present

## 2023-12-31 DIAGNOSIS — K5909 Other constipation: Secondary | ICD-10-CM | POA: Diagnosis not present

## 2023-12-31 DIAGNOSIS — F17210 Nicotine dependence, cigarettes, uncomplicated: Secondary | ICD-10-CM | POA: Insufficient documentation

## 2023-12-31 NOTE — Assessment & Plan Note (Signed)
Smoke cessation was discussed with patient.

## 2023-12-31 NOTE — Assessment & Plan Note (Addendum)
 Repeat CT scan shows stable lung nodules, likely benign.

## 2023-12-31 NOTE — Progress Notes (Signed)
 Hematology/Oncology Consult note [phone] [phone]         Patient Care Team: Patient, No Pcp Per as PCP - General (General Practice) Babara Call, MD as Consulting Physician (Oncology)  CHIEF COMPLAINTS/REASON FOR VISIT:  Lung nodule,    ASSESSMENT & PLAN:   Lung nodule Repeat CT scan shows stable lung nodules, likely benign.   Tobacco use Smoke cessation was discussed with patient.  Chronic GI cramps, stool changes, recommend patient to follow-up with primary care provider.  Consider GI evaluation.  All questions were answered. The patient knows to call the clinic with any problems, questions or concerns.  Call Babara, MD, PhD [facility] Hematology Oncology 12/31/2023   HISTORY OF PRESENTING ILLNESS:   Michelle Kaufman is a  78 y.o.  female with PMH listed below was seen in consultation at the request of  [place] HERO, MD  for evaluation of unintentional weight loss  Patient reports that she used to weigh 110 pounds.  Currently she weighs 99 pounds.  She does not have good appetite and does not eat much.,  Patient is convinced that something is not right with her.  Patient has had CT abdomen pelvis scan done which did not explain her chronic abdominal pain.  She has chronic constipation.  Chronic abdominal pain and she has followed up with gastroenterology.Last seen by Dr. Therisa on 09/20/2021.  Patient is recommended to have a repeat EGD for further evaluation.  She was also recommended to take lactulose  for chronic constipation.  08/15/2021, CT abdomen pelvis with contrast showed no acute abnormality in abdomen/pelvis.  Moderate colonic stool.  Subcentimeter low-density lesions in the kidneys, liver and spleen, too small to be accurately characterize.  Likely cyst.  No need for follow-up.    INTERVAL HISTORY Michelle Kaufman is a 78 y.o. female who has above history reviewed by me today presents for follow up visit for lung nodule, weight loss.  She  continues to smoke on some days.  Weight has been relatively stable,  She denies SOB, chest pain, hemoptysis + Patient reports that greenish discoloration of her stool and stomach cramps.  No nausea vomiting diarrhea.  Patient reports that she has been seen by primary care provider.  MEDICAL HISTORY:  Past Medical History:  Diagnosis Date   Chronic kidney disease    CKD, Stage 3   Hypercalcemia    Hypertension    Osteoporosis     SURGICAL HISTORY: Past Surgical History:  Procedure Laterality Date   COLONOSCOPY WITH PROPOFOL  N/A 10/02/2014   Procedure: COLONOSCOPY WITH PROPOFOL ;  Surgeon: Donnice Vaughn Manes, MD;  Location: [facility] ENDOSCOPY;  Service: Endoscopy;  Laterality: N/A;   COLONOSCOPY WITH PROPOFOL  N/A 05/20/2020   Procedure: COLONOSCOPY WITH PROPOFOL ;  Surgeon: Therisa Bi, MD;  Location: [facility] ENDOSCOPY;  Service: Gastroenterology;  Laterality: N/A;   COLONOSCOPY WITH PROPOFOL  N/A 03/30/2023   Procedure: COLONOSCOPY WITH PROPOFOL ;  Surgeon: Therisa Bi, MD;  Location: [facility] ENDOSCOPY;  Service: Gastroenterology;  Laterality: N/A;   ESOPHAGOGASTRODUODENOSCOPY (EGD) WITH PROPOFOL  N/A 05/20/2020   Procedure: ESOPHAGOGASTRODUODENOSCOPY (EGD) WITH PROPOFOL ;  Surgeon: Therisa Bi, MD;  Location: [facility] ENDOSCOPY;  Service: Gastroenterology;  Laterality: N/A;   JOINT REPLACEMENT Right 2003   POLYPECTOMY  03/30/2023   Procedure: POLYPECTOMY;  Surgeon: Therisa Bi, MD;  Location: [facility] ENDOSCOPY;  Service: Gastroenterology;;    SOCIAL HISTORY: Social History   Socioeconomic History   Marital status: Married    Spouse name: Not on file   Number of children:  Not on file   Years of education: Not on file   Highest education level: Not on file  Occupational History   Not on file  Tobacco Use   Smoking status: Some Days    Current packs/day: 0.50    Average packs/day: 0.5 packs/day for 60.0 years (30.0 ttl pk-yrs)    Types: Cigarettes   Smokeless tobacco: Never  Vaping Use   Vaping  status: Never Used  Substance and Sexual Activity   Alcohol use: Never   Drug use: Never   Sexual activity: Not on file  Other Topics Concern   Not on file  Social History Narrative   Not on file   Social Drivers of Health   Financial Resource Strain: Low Risk  (10/17/2023)   Received [facility]   Overall Financial Resource Strain (CARDIA)    Difficulty of Paying Living Expenses: Not hard at all  Food Insecurity: No Food Insecurity (10/17/2023)   Received from [facility]   Hunger Vital Sign    Within the past 12 months, you worried that your food would run out before you got the money to buy more.: Never true    Within the past 12 months, the food you bought just didn't last and you didn't have money to get more.: Never true  Transportation Needs: No Transportation Needs (10/17/2023)   Received from [facility] - Transportation    In the past 12 months, has lack of transportation kept you from medical appointments or from getting medications?: No    Lack of Transportation (Non-Medical): No  Physical Activity: Not on file  Stress: Not on file  Social Connections: Not on file  Intimate Partner Violence: Not on file    FAMILY HISTORY: Family History  Problem Relation Age of Onset   Breast cancer Neg Hx     ALLERGIES:  is allergic to alendronate.  MEDICATIONS:  Current Outpatient Medications  Medication Sig Dispense Refill   albuterol (VENTOLIN HFA) 108 (90 Base) MCG/ACT inhaler 1 puff as needed Inhalation every 4 hrs; Duration: 30 days     famotidine  (PEPCID ) 20 MG tablet TAKE ONE TABLET BY MOUTH AT BEDTIME AS NEEDED Oral; Duration: 30 Days     meclizine (ANTIVERT) 25 MG tablet Take 25 mg by mouth 3 (three) times daily as needed.     polyethylene glycol powder (GLYCOLAX/MIRALAX) 17 GM/SCOOP powder Take by mouth once.     senna (SENOKOT) 8.6 MG tablet Take 1-2 tablets by mouth.     Calcium Carbonate-Vit D-Min  (CALCIUM 600+D PLUS MINERALS) 600-400 MG-UNIT TABS Take 1 tablet by mouth 2 (two) times daily. (Patient not taking: Reported on 12/31/2023)     No current facility-administered medications for this visit.    Review of Systems  Constitutional:  Negative for appetite change, chills, fatigue, fever and unexpected weight change.  HENT:   Negative for hearing loss and voice change.   Eyes:  Negative for eye problems.  Respiratory:  Negative for chest tightness and cough.   Cardiovascular:  Negative for chest pain.  Gastrointestinal:  Negative for abdominal distention, blood in stool, nausea and vomiting.       Abdominal cramps  Endocrine: Negative for hot flashes.  Genitourinary:  Negative for difficulty urinating and frequency.   Musculoskeletal:  Negative for arthralgias.  Skin:  Negative for itching and rash.  Neurological:  Negative for extremity weakness.  Hematological:  Negative for adenopathy.  Psychiatric/Behavioral:  Negative for  confusion.    PHYSICAL EXAMINATION: ECOG PERFORMANCE STATUS: 1 - Symptomatic but completely ambulatory Vitals:   12/31/23 1306  BP: (!) 110/53  Pulse: (!) 43  Resp: 18  Temp: (!) 97.2 F (36.2 C)  SpO2: 100%   Filed Weights   12/31/23 1306  Weight: 103 lb 11.2 oz (47 kg)    Physical Exam Constitutional:      General: She is not in acute distress.    Comments: Thin built  HENT:     Head: Normocephalic and atraumatic.  Eyes:     General: No scleral icterus. Cardiovascular:     Rate and Rhythm: Normal rate and regular rhythm.  Pulmonary:     Effort: Pulmonary effort is normal. No respiratory distress.  Abdominal:     General: There is no distension.  Musculoskeletal:        General: No deformity. Normal range of motion.     Cervical back: Normal range of motion and neck supple.  Skin:    Findings: No erythema or rash.  Neurological:     Mental Status: She is alert and oriented to person, place, and time. Mental status is at  baseline.  Psychiatric:        Mood and Affect: Mood normal.     LABORATORY DATA:  I have reviewed the data as listed    Latest Ref Rng & Units 09/24/2021    2:55 PM 09/09/2021    7:15 AM 04/11/2020    9:13 PM  CBC  WBC 4.0 - 10.5 K/uL 5.2  3.9  6.6   Hemoglobin 12.0 - 15.0 g/dL 85.2  84.6  85.5   Hematocrit 36.0 - 46.0 % 46.3  48.6  44.3   Platelets 150 - 400 K/uL 235  242  216       Latest Ref Rng & Units 09/24/2021    2:55 PM 09/09/2021    7:15 AM 08/15/2021    8:08 AM  CMP  Glucose 70 - 99 mg/dL 89  88    BUN 8 - 23 mg/dL 11  15    Creatinine 9.55 - 1.00 mg/dL 8.90  8.82  8.69   Sodium 135 - 145 mmol/L 134  138    Potassium 3.5 - 5.1 mmol/L 4.8  3.7    Chloride 98 - 111 mmol/L 100  103    CO2 22 - 32 mmol/L 22  23    Calcium 8.9 - 10.3 mg/dL 9.6  89.9    Total Protein 6.5 - 8.1 g/dL 7.1     Total Bilirubin 0.3 - 1.2 mg/dL 1.0     Alkaline Phos 38 - 126 U/L 55     AST 15 - 41 U/L 23     ALT 0 - 44 U/L 9         RADIOGRAPHIC STUDIES: I have personally reviewed the radiological images as listed and agreed with the findings in the report. CT Chest Wo Contrast Result Date: 11/21/2023 CLINICAL DATA:  Pulmonary nodule EXAM: CT CHEST WITHOUT CONTRAST TECHNIQUE: Multidetector CT imaging of the chest was performed following the standard protocol without IV contrast. RADIATION DOSE REDUCTION: This exam was performed according to the departmental dose-optimization program which includes automated exposure control, adjustment of the mA and/or kV according to patient size and/or use of iterative reconstruction technique. COMPARISON:  10/23/2022 FINDINGS: Cardiovascular: Unenhanced imaging the heart is unremarkable without pericardial effusion. Normal caliber of the thoracic aorta. Atherosclerosis of the aorta and coronary vasculature. Mediastinum/Nodes: No enlarged mediastinal  or axillary lymph nodes. Thyroid  gland, trachea, and esophagus demonstrate no significant findings. Lungs/Pleura:  Stable 3 mm right upper lobe pulmonary nodule, reference image 42/4. Stable 4 mm left upper lobe pulmonary nodule reference image 47/4. Resolution of the lingular nodule seen on prior study. 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 No acute airspace disease, effusion, or pneumothorax. Central airways are patent. Upper Abdomen: No acute abnormality. Musculoskeletal: No acute or destructive bony abnormalities. Reconstructed images demonstrate no additional findings. IMPRESSION: 1. Bilateral sub 4 mm pulmonary nodules, unchanged since prior exam. Given size and stability, no specific imaging follow-up is recommended. 2. No acute intrathoracic process. 3. Aortic Atherosclerosis (ICD10-I70.0). Coronary artery atherosclerosis. Electronically Signed   By: Ozell Daring M.D.   On: 11/21/2023 20:33
